# Patient Record
Sex: Female | Born: 1975 | Hispanic: Yes | Marital: Married | Smoking: Never smoker
Health system: Southern US, Community
[De-identification: ages and names within clinical notes are randomized; demographics above are authoritative.]

## PROBLEM LIST (undated history)

## (undated) DIAGNOSIS — J302 Other seasonal allergic rhinitis: Secondary | ICD-10-CM

## (undated) DIAGNOSIS — N390 Urinary tract infection, site not specified: Secondary | ICD-10-CM

## (undated) DIAGNOSIS — Z87442 Personal history of urinary calculi: Secondary | ICD-10-CM

---

## 1998-04-24 ENCOUNTER — Other Ambulatory Visit: Admission: RE | Admit: 1998-04-24 | Discharge: 1998-04-24 | Payer: Self-pay | Admitting: Gynecology

## 1998-11-07 ENCOUNTER — Other Ambulatory Visit: Admission: RE | Admit: 1998-11-07 | Discharge: 1998-11-07 | Payer: Self-pay | Admitting: Obstetrics and Gynecology

## 1999-02-16 ENCOUNTER — Other Ambulatory Visit: Admission: RE | Admit: 1999-02-16 | Discharge: 1999-02-16 | Payer: Self-pay | Admitting: Obstetrics and Gynecology

## 1999-03-28 ENCOUNTER — Other Ambulatory Visit: Admission: RE | Admit: 1999-03-28 | Discharge: 1999-03-28 | Payer: Self-pay | Admitting: Obstetrics and Gynecology

## 1999-06-29 ENCOUNTER — Other Ambulatory Visit: Admission: RE | Admit: 1999-06-29 | Discharge: 1999-06-29 | Payer: Self-pay | Admitting: Obstetrics and Gynecology

## 2000-02-11 ENCOUNTER — Other Ambulatory Visit: Admission: RE | Admit: 2000-02-11 | Discharge: 2000-02-11 | Payer: Self-pay | Admitting: Family Medicine

## 2001-03-05 ENCOUNTER — Other Ambulatory Visit: Admission: RE | Admit: 2001-03-05 | Discharge: 2001-03-05 | Payer: Self-pay | Admitting: Internal Medicine

## 2002-01-19 ENCOUNTER — Other Ambulatory Visit: Admission: RE | Admit: 2002-01-19 | Discharge: 2002-01-19 | Payer: Self-pay | Admitting: Family Medicine

## 2002-09-30 ENCOUNTER — Other Ambulatory Visit: Admission: RE | Admit: 2002-09-30 | Discharge: 2002-09-30 | Payer: Self-pay | Admitting: Obstetrics & Gynecology

## 2003-08-26 ENCOUNTER — Encounter: Payer: Self-pay | Admitting: Obstetrics & Gynecology

## 2003-08-26 ENCOUNTER — Encounter: Admission: RE | Admit: 2003-08-26 | Discharge: 2003-08-26 | Payer: Self-pay | Admitting: Obstetrics & Gynecology

## 2003-10-04 ENCOUNTER — Other Ambulatory Visit: Admission: RE | Admit: 2003-10-04 | Discharge: 2003-10-04 | Payer: Self-pay | Admitting: Obstetrics & Gynecology

## 2004-09-10 ENCOUNTER — Ambulatory Visit (HOSPITAL_COMMUNITY): Admission: RE | Admit: 2004-09-10 | Discharge: 2004-09-10 | Payer: Self-pay | Admitting: Obstetrics & Gynecology

## 2004-11-19 ENCOUNTER — Ambulatory Visit (HOSPITAL_COMMUNITY): Admission: RE | Admit: 2004-11-19 | Discharge: 2004-11-19 | Payer: Self-pay | Admitting: Urology

## 2005-01-16 ENCOUNTER — Inpatient Hospital Stay (HOSPITAL_COMMUNITY): Admission: AD | Admit: 2005-01-16 | Discharge: 2005-01-16 | Payer: Self-pay | Admitting: Obstetrics and Gynecology

## 2005-04-23 ENCOUNTER — Encounter: Admission: RE | Admit: 2005-04-23 | Discharge: 2005-04-23 | Payer: Self-pay | Admitting: Obstetrics & Gynecology

## 2005-06-26 ENCOUNTER — Inpatient Hospital Stay (HOSPITAL_COMMUNITY): Admission: AD | Admit: 2005-06-26 | Discharge: 2005-06-28 | Payer: Self-pay | Admitting: *Deleted

## 2005-06-29 ENCOUNTER — Encounter: Admission: RE | Admit: 2005-06-29 | Discharge: 2005-07-29 | Payer: Self-pay | Admitting: *Deleted

## 2005-07-30 ENCOUNTER — Encounter: Admission: RE | Admit: 2005-07-30 | Discharge: 2005-08-29 | Payer: Self-pay | Admitting: *Deleted

## 2005-08-30 ENCOUNTER — Encounter: Admission: RE | Admit: 2005-08-30 | Discharge: 2005-09-28 | Payer: Self-pay | Admitting: *Deleted

## 2005-09-29 ENCOUNTER — Encounter: Admission: RE | Admit: 2005-09-29 | Discharge: 2005-10-29 | Payer: Self-pay | Admitting: *Deleted

## 2005-10-30 ENCOUNTER — Encounter: Admission: RE | Admit: 2005-10-30 | Discharge: 2005-11-28 | Payer: Self-pay | Admitting: *Deleted

## 2005-11-29 ENCOUNTER — Encounter: Admission: RE | Admit: 2005-11-29 | Discharge: 2005-12-29 | Payer: Self-pay | Admitting: *Deleted

## 2005-12-30 ENCOUNTER — Encounter: Admission: RE | Admit: 2005-12-30 | Discharge: 2006-01-29 | Payer: Self-pay | Admitting: *Deleted

## 2006-01-22 ENCOUNTER — Ambulatory Visit: Payer: Self-pay | Admitting: Internal Medicine

## 2006-01-30 ENCOUNTER — Encounter: Admission: RE | Admit: 2006-01-30 | Discharge: 2006-02-26 | Payer: Self-pay | Admitting: *Deleted

## 2006-02-27 ENCOUNTER — Encounter: Admission: RE | Admit: 2006-02-27 | Discharge: 2006-03-29 | Payer: Self-pay | Admitting: *Deleted

## 2006-03-13 ENCOUNTER — Ambulatory Visit (HOSPITAL_COMMUNITY): Admission: RE | Admit: 2006-03-13 | Discharge: 2006-03-13 | Payer: Self-pay | Admitting: Urology

## 2006-03-30 ENCOUNTER — Encounter: Admission: RE | Admit: 2006-03-30 | Discharge: 2006-04-28 | Payer: Self-pay | Admitting: *Deleted

## 2006-04-29 ENCOUNTER — Encounter: Admission: RE | Admit: 2006-04-29 | Discharge: 2006-05-29 | Payer: Self-pay | Admitting: *Deleted

## 2006-04-30 ENCOUNTER — Ambulatory Visit: Payer: Self-pay | Admitting: Internal Medicine

## 2006-05-30 ENCOUNTER — Encounter: Admission: RE | Admit: 2006-05-30 | Discharge: 2006-06-28 | Payer: Self-pay | Admitting: *Deleted

## 2006-06-29 ENCOUNTER — Encounter: Admission: RE | Admit: 2006-06-29 | Discharge: 2006-07-29 | Payer: Self-pay | Admitting: *Deleted

## 2006-07-30 ENCOUNTER — Encounter: Admission: RE | Admit: 2006-07-30 | Discharge: 2006-08-29 | Payer: Self-pay | Admitting: *Deleted

## 2006-08-30 ENCOUNTER — Encounter: Admission: RE | Admit: 2006-08-30 | Discharge: 2006-09-28 | Payer: Self-pay | Admitting: *Deleted

## 2006-09-29 ENCOUNTER — Encounter: Admission: RE | Admit: 2006-09-29 | Discharge: 2006-10-29 | Payer: Self-pay | Admitting: *Deleted

## 2006-10-30 ENCOUNTER — Encounter: Admission: RE | Admit: 2006-10-30 | Discharge: 2006-11-28 | Payer: Self-pay | Admitting: *Deleted

## 2006-11-29 ENCOUNTER — Encounter: Admission: RE | Admit: 2006-11-29 | Discharge: 2006-12-27 | Payer: Self-pay | Admitting: *Deleted

## 2007-05-27 ENCOUNTER — Inpatient Hospital Stay (HOSPITAL_COMMUNITY): Admission: AD | Admit: 2007-05-27 | Discharge: 2007-05-27 | Payer: Self-pay | Admitting: Obstetrics and Gynecology

## 2007-08-14 ENCOUNTER — Inpatient Hospital Stay (HOSPITAL_COMMUNITY): Admission: AD | Admit: 2007-08-14 | Discharge: 2007-08-15 | Payer: Self-pay | Admitting: Obstetrics and Gynecology

## 2007-08-29 ENCOUNTER — Inpatient Hospital Stay (HOSPITAL_COMMUNITY): Admission: AD | Admit: 2007-08-29 | Discharge: 2007-08-31 | Payer: Self-pay | Admitting: *Deleted

## 2007-09-21 ENCOUNTER — Encounter: Payer: Self-pay | Admitting: Internal Medicine

## 2007-09-21 DIAGNOSIS — N3090 Cystitis, unspecified without hematuria: Secondary | ICD-10-CM | POA: Insufficient documentation

## 2008-12-09 HISTORY — PX: LITHOTRIPSY: SUR834

## 2010-04-12 ENCOUNTER — Encounter: Admission: RE | Admit: 2010-04-12 | Discharge: 2010-04-12 | Payer: Self-pay | Admitting: Obstetrics and Gynecology

## 2010-04-12 ENCOUNTER — Ambulatory Visit (HOSPITAL_COMMUNITY): Admission: RE | Admit: 2010-04-12 | Discharge: 2010-04-12 | Payer: Self-pay | Admitting: Obstetrics and Gynecology

## 2010-05-23 ENCOUNTER — Inpatient Hospital Stay (HOSPITAL_COMMUNITY): Admission: AD | Admit: 2010-05-23 | Discharge: 2010-05-25 | Payer: Self-pay | Admitting: Obstetrics and Gynecology

## 2010-12-29 ENCOUNTER — Encounter: Payer: Self-pay | Admitting: Obstetrics & Gynecology

## 2010-12-29 ENCOUNTER — Encounter: Payer: Self-pay | Admitting: Urology

## 2011-02-24 LAB — RPR: RPR Ser Ql: NONREACTIVE

## 2011-02-24 LAB — CBC
HCT: 27.4 % — ABNORMAL LOW (ref 36.0–46.0)
HCT: 32.4 % — ABNORMAL LOW (ref 36.0–46.0)
Hemoglobin: 10.8 g/dL — ABNORMAL LOW (ref 12.0–15.0)
Hemoglobin: 9.3 g/dL — ABNORMAL LOW (ref 12.0–15.0)
MCHC: 33.3 g/dL (ref 30.0–36.0)
MCHC: 33.9 g/dL (ref 30.0–36.0)
MCV: 86.7 fL (ref 78.0–100.0)
MCV: 87 fL (ref 78.0–100.0)
Platelets: 174 10*3/uL (ref 150–400)
Platelets: 210 10*3/uL (ref 150–400)
RBC: 3.15 MIL/uL — ABNORMAL LOW (ref 3.87–5.11)
RBC: 3.74 MIL/uL — ABNORMAL LOW (ref 3.87–5.11)
RDW: 14.5 % (ref 11.5–15.5)
RDW: 14.7 % (ref 11.5–15.5)
WBC: 10 10*3/uL (ref 4.0–10.5)
WBC: 9 10*3/uL (ref 4.0–10.5)

## 2011-02-24 LAB — GLUCOSE, CAPILLARY: Glucose-Capillary: 105 mg/dL — ABNORMAL HIGH (ref 70–99)

## 2011-04-23 NOTE — Consult Note (Signed)
Samantha [name], Samantha [name] NO.:  [id number]   MEDICAL RECORD NO.:  [id number]          PATIENT TYPE:  MAT   LOCATION:  [facility]                          FACILITY:  [facility]   PHYSICIAN:  Lenoard Aden, M.D.DATE OF BIRTH:  [birth year]   DATE OF CONSULTATION:  DATE OF DISCHARGE:                                 CONSULTATION   CHIEF COMPLAINT:  Left flank pain.   This is a 35 year old female, G4, P2 0-1-2 with a new onset history of  left urolithiasis, confirmed by ultrasound and seen by Urology with  increased frequency of left flank pain noted this a.m. who presents by  EMS with increased presumed renal colic.   She has no known drug allergies.   Medications are prenatal vitamins, Darvocet as needed and Macrobid.   She has a history of kidney stones as noted.  Abnormal Pap smear.  Recurrent UTIs.  Fibrocystic breast disease and unspecified neck pain in  addition to an unspecified history of pyelonephritis.  History of a  LEEP.   SOCIAL HISTORY:  Noncontributory.  She is a non-smoker, nondrinker,  denies domestic or physical violence.   Family history of varicosities.   Pregnancy is remarkable for two vaginal deliveries and one spontaneous  pregnancy loss.   PHYSICAL EXAMINATION:  On exam patient is an uncomfortable appearing  female in no acute distress.  Vital signs are stable.  Patient is  afebrile.  HEENT normal. Lungs clear.  Heart regular rhythm.  Abdomen  soft, __________, nontender.  No CVA tenderness noted.  Left flank pain  is palpably over the left flank to left portion of the left iliac crest.  Cervix is closed, long and out of the pelvis.  Extremities show no  cords.  Neurological exam is nonfocal.   IMPRESSION:  Presumed left renal colic with presumed urolithiasis as  previously documented.   Plan will be to check urinalysis, check renal ultrasound, check CBC with  differential to rule out pyelonephritis.  Will consult Urology as  needed.  Given  Demerol and Phenergan for pain relief now.      Lenoard Aden, M.D.  Electronically Signed     RJT/MEDQ  D:  05/27/2007  T:  05/27/2007  Job:  [id number]   cc:   Lenoard Aden, M.D.  Fax: [phone]

## 2011-09-19 LAB — CBC
HCT: 28.2 — ABNORMAL LOW
HCT: 34.5 — ABNORMAL LOW
Hemoglobin: 11.5 — ABNORMAL LOW
Hemoglobin: 9.3 — ABNORMAL LOW
MCHC: 33.1
MCHC: 33.3
MCV: 83.2
MCV: 83.4
Platelets: 205
Platelets: 263
RBC: 3.39 — ABNORMAL LOW
RBC: 4.13
RDW: 14.4 — ABNORMAL HIGH
RDW: 14.9 — ABNORMAL HIGH
WBC: 10.8 — ABNORMAL HIGH
WBC: 10.8 — ABNORMAL HIGH

## 2011-09-19 LAB — URINALYSIS, ROUTINE W REFLEX MICROSCOPIC
Glucose, UA: NEGATIVE
Ketones, ur: 15 — AB
Leukocytes, UA: NEGATIVE
Nitrite: NEGATIVE
Protein, ur: 30 — AB
Specific Gravity, Urine: 1.02
Urobilinogen, UA: 0.2
pH: 6

## 2011-09-19 LAB — RPR: RPR Ser Ql: NONREACTIVE

## 2011-09-19 LAB — URINE MICROSCOPIC-ADD ON

## 2011-09-19 LAB — CCBB MATERNAL DONOR DRAW

## 2011-09-20 LAB — CBC
HCT: 33 — ABNORMAL LOW
Hemoglobin: 11.2 — ABNORMAL LOW
MCHC: 33.8
MCV: 84.8
Platelets: 246
RBC: 3.89
RDW: 14.2 — ABNORMAL HIGH
WBC: 13.5 — ABNORMAL HIGH

## 2011-09-20 LAB — URINALYSIS, ROUTINE W REFLEX MICROSCOPIC
Bilirubin Urine: NEGATIVE
Glucose, UA: NEGATIVE
Ketones, ur: 15 — AB
Leukocytes, UA: NEGATIVE
Nitrite: NEGATIVE
Protein, ur: NEGATIVE
Specific Gravity, Urine: 1.01
Urobilinogen, UA: 0.2
pH: 6

## 2011-09-20 LAB — URINE MICROSCOPIC-ADD ON

## 2011-09-20 LAB — RPR: RPR Ser Ql: NONREACTIVE

## 2011-09-25 LAB — URINALYSIS, ROUTINE W REFLEX MICROSCOPIC
Bilirubin Urine: NEGATIVE
Glucose, UA: NEGATIVE
Ketones, ur: NEGATIVE
Nitrite: NEGATIVE
Protein, ur: NEGATIVE
Specific Gravity, Urine: 1.01
Urobilinogen, UA: 0.2
pH: 7.5

## 2011-09-25 LAB — URINE CULTURE: Colony Count: >=100000

## 2011-09-25 LAB — URINE MICROSCOPIC-ADD ON

## 2011-09-25 LAB — CBC
HCT: 33 — ABNORMAL LOW
Hemoglobin: 11.2 — ABNORMAL LOW
MCHC: 33.9
MCV: 89.7
Platelets: 221
RBC: 3.69 — ABNORMAL LOW
RDW: 13.2
WBC: 9.4

## 2011-09-25 LAB — DIFFERENTIAL
Basophils Absolute: 0
Basophils Relative: 0
Eosinophils Absolute: 0.1
Eosinophils Relative: 1
Monocytes Absolute: 0.6

## 2012-04-30 ENCOUNTER — Encounter (HOSPITAL_COMMUNITY): Payer: Self-pay | Admitting: Certified Registered Nurse Anesthetist

## 2012-04-30 ENCOUNTER — Inpatient Hospital Stay (HOSPITAL_COMMUNITY): Payer: BC Managed Care – PPO | Admitting: Certified Registered Nurse Anesthetist

## 2012-04-30 ENCOUNTER — Inpatient Hospital Stay (HOSPITAL_COMMUNITY): Payer: BC Managed Care – PPO

## 2012-04-30 ENCOUNTER — Encounter (HOSPITAL_COMMUNITY): Payer: Self-pay | Admitting: *Deleted

## 2012-04-30 ENCOUNTER — Encounter (HOSPITAL_COMMUNITY)
Admission: AD | Disposition: A | Discharge status: Home / Self Care | Payer: Self-pay | Source: Other Acute Inpatient Hospital | Attending: Urology

## 2012-04-30 ENCOUNTER — Emergency Department (EMERGENCY_DEPARTMENT_HOSPITAL)
Admission: AD | Admit: 2012-04-30 | Discharge: 2012-04-30 | Disposition: A | Discharge status: Home Health | Payer: BC Managed Care – PPO | Source: Ambulatory Visit | Admitting: Obstetrics & Gynecology

## 2012-04-30 ENCOUNTER — Ambulatory Visit (HOSPITAL_COMMUNITY)
Admission: AD | Admit: 2012-04-30 | Discharge: 2012-05-01 | Disposition: A | Discharge status: Home Health | Payer: BC Managed Care – PPO | Source: Ambulatory Visit | Attending: Obstetrics & Gynecology | Admitting: Obstetrics & Gynecology

## 2012-04-30 DIAGNOSIS — N200 Calculus of kidney: Secondary | ICD-10-CM

## 2012-04-30 DIAGNOSIS — F419 Anxiety disorder, unspecified: Secondary | ICD-10-CM | POA: Clinically undetermined

## 2012-04-30 DIAGNOSIS — F32A Depression, unspecified: Secondary | ICD-10-CM | POA: Clinically undetermined

## 2012-04-30 DIAGNOSIS — R1031 Right lower quadrant pain: Secondary | ICD-10-CM | POA: Insufficient documentation

## 2012-04-30 DIAGNOSIS — Z8632 Personal history of gestational diabetes: Secondary | ICD-10-CM

## 2012-04-30 DIAGNOSIS — N1330 Unspecified hydronephrosis: Secondary | ICD-10-CM | POA: Insufficient documentation

## 2012-04-30 DIAGNOSIS — N201 Calculus of ureter: Secondary | ICD-10-CM | POA: Insufficient documentation

## 2012-04-30 DIAGNOSIS — R509 Fever, unspecified: Secondary | ICD-10-CM | POA: Insufficient documentation

## 2012-04-30 DIAGNOSIS — F329 Major depressive disorder, single episode, unspecified: Secondary | ICD-10-CM | POA: Clinically undetermined

## 2012-04-30 DIAGNOSIS — R110 Nausea: Secondary | ICD-10-CM | POA: Insufficient documentation

## 2012-04-30 HISTORY — PX: CYSTOSCOPY W/ RETROGRADES: SHX1426

## 2012-04-30 LAB — DIFFERENTIAL
Basophils Relative: 1 % (ref 0–1)
Eosinophils Absolute: 0 10*3/uL (ref 0.0–0.7)
Monocytes Absolute: 0.7 10*3/uL (ref 0.1–1.0)
Neutrophils Relative %: 84 % — ABNORMAL HIGH (ref 43–77)

## 2012-04-30 LAB — COMPREHENSIVE METABOLIC PANEL
ALT: 12 U/L (ref 0–35)
Calcium: 8.6 mg/dL (ref 8.4–10.5)
Creatinine, Ser: 1.02 mg/dL (ref 0.50–1.10)
GFR calc Af Amer: 81 mL/min — ABNORMAL LOW (ref >90)
Glucose, Bld: 111 mg/dL — ABNORMAL HIGH (ref 70–99)
Sodium: 134 mEq/L — ABNORMAL LOW (ref 135–145)
Total Protein: 7.4 g/dL (ref 6.0–8.3)

## 2012-04-30 LAB — URINE MICROSCOPIC-ADD ON

## 2012-04-30 LAB — URINALYSIS, ROUTINE W REFLEX MICROSCOPIC
Bilirubin Urine: NEGATIVE
Ketones, ur: NEGATIVE mg/dL
Nitrite: NEGATIVE
Urobilinogen, UA: 0.2 mg/dL (ref 0.0–1.0)

## 2012-04-30 LAB — CBC
Hemoglobin: 11.5 g/dL — ABNORMAL LOW (ref 12.0–15.0)
MCH: 29.6 pg (ref 26.0–34.0)
MCHC: 33 g/dL (ref 30.0–36.0)
Platelets: 206 10*3/uL (ref 150–400)

## 2012-04-30 LAB — AMYLASE: Amylase: 42 U/L (ref 0–105)

## 2012-04-30 LAB — LIPASE, BLOOD: Lipase: 18 U/L (ref 11–59)

## 2012-04-30 SURGERY — CYSTOSCOPY, WITH RETROGRADE PYELOGRAM
Anesthesia: General | Site: Ureter | Laterality: Right | Wound class: Clean Contaminated

## 2012-04-30 MED ORDER — HYDROMORPHONE HCL PF 1 MG/ML IJ SOLN
1.0000 mg | Freq: Once | INTRAMUSCULAR | Status: AC
  Administered 2012-04-30: 1 mg via INTRAVENOUS
  Filled 2012-04-30 (×2): qty 1

## 2012-04-30 MED ORDER — ONDANSETRON HCL 4 MG PO TABS: 8.0000 mg | ORAL_TABLET | Freq: Once | ORAL | Status: DC

## 2012-04-30 MED ORDER — LACTATED RINGERS IV SOLN
INTRAVENOUS | Status: DC
  Administered 2012-05-01: via INTRAVENOUS

## 2012-04-30 MED ORDER — LIDOCAINE HCL (CARDIAC) 20 MG/ML IV SOLN
INTRAVENOUS | Status: DC | PRN
  Administered 2012-04-30: 80 mg via INTRAVENOUS

## 2012-04-30 MED ORDER — ONDANSETRON HCL 4 MG/2ML IJ SOLN
INTRAMUSCULAR | Status: DC | PRN
  Administered 2012-04-30: 4 mg via INTRAVENOUS

## 2012-04-30 MED ORDER — ROCURONIUM BROMIDE 100 MG/10ML IV SOLN
INTRAVENOUS | Status: DC | PRN
  Administered 2012-04-30: 20 mg via INTRAVENOUS

## 2012-04-30 MED ORDER — IOHEXOL 300 MG/ML  SOLN
INTRAMUSCULAR | Status: DC | PRN
  Administered 2012-04-30: 7 mL via INTRAVENOUS

## 2012-04-30 MED ORDER — DEXTROSE 5 % IV SOLN
1.0000 g | INTRAVENOUS | Status: DC | PRN
  Administered 2012-04-30: 1 g via INTRAVENOUS

## 2012-04-30 MED ORDER — DEXTROSE 5 % IV SOLN
1.0000 g | INTRAVENOUS | Status: DC
  Filled 2012-04-30: qty 10

## 2012-04-30 MED ORDER — LACTATED RINGERS IV SOLN
INTRAVENOUS | Status: DC | PRN
  Administered 2012-04-30: 23:00:00 via INTRAVENOUS

## 2012-04-30 MED ORDER — ACETAMINOPHEN 325 MG PO TABS
650.0000 mg | ORAL_TABLET | Freq: Once | ORAL | Status: DC
  Filled 2012-04-30: qty 2

## 2012-04-30 MED ORDER — NEOSTIGMINE METHYLSULFATE 1 MG/ML IJ SOLN
INTRAMUSCULAR | Status: DC | PRN
  Administered 2012-04-30: 3 mg via INTRAVENOUS

## 2012-04-30 MED ORDER — STERILE WATER FOR IRRIGATION IR SOLN
Status: DC | PRN
  Administered 2012-04-30: 3000 mL

## 2012-04-30 MED ORDER — IOHEXOL 300 MG/ML  SOLN
100.0000 mL | Freq: Once | INTRAMUSCULAR | Status: AC | PRN
  Administered 2012-04-30: 100 mL via INTRAVENOUS

## 2012-04-30 MED ORDER — DEXTROSE 5 % IV SOLN
1.0000 g | Freq: Once | INTRAVENOUS | Status: DC
  Filled 2012-04-30: qty 10

## 2012-04-30 MED ORDER — SODIUM CHLORIDE 0.9 % IV SOLN
25.0000 mg | Freq: Once | INTRAVENOUS | Status: AC
  Administered 2012-04-30: 25 mg via INTRAVENOUS
  Filled 2012-04-30: qty 1

## 2012-04-30 MED ORDER — HYDROMORPHONE HCL PF 1 MG/ML IJ SOLN: 0.2500 mg | INTRAMUSCULAR | Status: DC | PRN

## 2012-04-30 MED ORDER — SODIUM CHLORIDE 0.9 % IR SOLN
Status: DC | PRN
  Administered 2012-04-30: 1000 mL

## 2012-04-30 MED ORDER — HYDROMORPHONE HCL PF 1 MG/ML IJ SOLN
1.0000 mg | Freq: Once | INTRAMUSCULAR | Status: AC
  Administered 2012-04-30: 1 mg via INTRAVENOUS
  Filled 2012-04-30: qty 1

## 2012-04-30 MED ORDER — LACTATED RINGERS IV SOLN: INTRAVENOUS | Status: DC

## 2012-04-30 MED ORDER — PHENYLEPHRINE HCL 10 MG/ML IJ SOLN
INTRAMUSCULAR | Status: DC | PRN
  Administered 2012-04-30: 160 ug via INTRAVENOUS
  Administered 2012-04-30 (×2): 80 ug via INTRAVENOUS

## 2012-04-30 MED ORDER — FENTANYL CITRATE 0.05 MG/ML IJ SOLN
INTRAMUSCULAR | Status: DC | PRN
  Administered 2012-04-30: 50 ug via INTRAVENOUS

## 2012-04-30 MED ORDER — MIDAZOLAM HCL 5 MG/5ML IJ SOLN
INTRAMUSCULAR | Status: DC | PRN
  Administered 2012-04-30: 1 mg via INTRAVENOUS

## 2012-04-30 MED ORDER — GLYCOPYRROLATE 0.2 MG/ML IJ SOLN
INTRAMUSCULAR | Status: DC | PRN
  Administered 2012-04-30: .4 mg via INTRAVENOUS

## 2012-04-30 MED ORDER — LACTATED RINGERS IV SOLN
INTRAVENOUS | Status: DC
  Administered 2012-04-30: 20:00:00 via INTRAVENOUS

## 2012-04-30 MED ORDER — ONDANSETRON HCL 4 MG/2ML IJ SOLN
4.0000 mg | Freq: Once | INTRAMUSCULAR | Status: AC
  Administered 2012-04-30: 4 mg via INTRAVENOUS
  Filled 2012-04-30: qty 2

## 2012-04-30 MED ORDER — SUCCINYLCHOLINE CHLORIDE 20 MG/ML IJ SOLN
INTRAMUSCULAR | Status: DC | PRN
  Administered 2012-04-30: 100 mg via INTRAVENOUS

## 2012-04-30 MED ORDER — 0.9 % SODIUM CHLORIDE (POUR BTL) OPTIME
TOPICAL | Status: DC | PRN
  Administered 2012-04-30: 1000 mL

## 2012-04-30 MED ORDER — PROPOFOL 10 MG/ML IV EMUL
INTRAVENOUS | Status: DC | PRN
  Administered 2012-04-30: 180 mg via INTRAVENOUS

## 2012-04-30 SURGICAL SUPPLY — 22 items
ADAPTER CATH URET PLST 4-6FR (CATHETERS) ×2 IMPLANT
ADPR CATH URET STRL DISP 4-6FR (CATHETERS) ×1
BAG URO CATCHER STRL LF (DRAPE) ×2 IMPLANT
BASKET ZERO TIP NITINOL 2.4FR (BASKET) IMPLANT
BSKT STON RTRVL ZERO TP 2.4FR (BASKET)
CATH INTERMIT  6FR 70CM (CATHETERS) ×1 IMPLANT
CATH URET 5FR 28IN CONE TIP (BALLOONS) ×1
CATH URET 5FR 70CM CONE TIP (BALLOONS) IMPLANT
CLOTH BEACON ORANGE TIMEOUT ST (SAFETY) ×2 IMPLANT
DRAPE CAMERA CLOSED 9X96 (DRAPES) ×2 IMPLANT
GLOVE BIOGEL M 7.0 STRL (GLOVE) ×2 IMPLANT
GLOVE SURG SS PI 8.5 STRL IVOR (GLOVE) ×1
GLOVE SURG SS PI 8.5 STRL STRW (GLOVE) IMPLANT
GOWN PREVENTION PLUS XLARGE (GOWN DISPOSABLE) ×2 IMPLANT
GOWN STRL NON-REIN LRG LVL3 (GOWN DISPOSABLE) ×2 IMPLANT
GUIDEWIRE ANG ZIPWIRE 038X150 (WIRE) ×1 IMPLANT
GUIDEWIRE STR DUAL SENSOR (WIRE) ×1 IMPLANT
MANIFOLD NEPTUNE II (INSTRUMENTS) ×2 IMPLANT
PACK CYSTO (CUSTOM PROCEDURE TRAY) ×2 IMPLANT
STENT CONTOUR 6FRX24X.038 (STENTS) ×1 IMPLANT
TUBING CONNECTING 10 (TUBING) ×2 IMPLANT
WIRE COONS/BENSON .038X145CM (WIRE) ×1 IMPLANT

## 2012-04-30 NOTE — MAU Note (Signed)
Saturday night started, fever, nausea and abd pain.  Has gotten worse.  Hx of kidney stone- thought it was the same,  Went to Dr on [facility], was dx with UTI- given antibiotics and pain meds. Urologist on Tues, dx with kidney stone. Went to Urgent care because was in so much pain.  Has gotten worse, unable to keep anything down.  Called urologist- couldn't get in until next week.

## 2012-04-30 NOTE — MAU Provider Note (Signed)
  History     CSN: [id number]  Arrival date and time: 04/30/12 1410   First Provider Initiated Contact with Patient 04/30/12 1549      Chief Complaint  Patient presents with  . Abdominal Pain   HPI This is a 36 y.o. female who presents with c/o abdominal pain, nausea and fever. Seen as described below but they could not see her today. "Feels terrible".  RN NOTE: Saturday night started, fever, nausea and abd pain. Has gotten worse. Hx of kidney stone- thought it was the same, Went to Dr on [facility], was dx with UTI- given antibiotics and pain meds. Urologist on Tues, dx with kidney stone. Went to Urgent care because was in so much pain. Has gotten worse, unable to keep anything down. Called urologist- couldn't get in until next week.  OB History    Grav Para Term Preterm Abortions TAB SAB Ect Mult Living   5 4 4  0 1 0 1 0 0 4      Past Medical History  Diagnosis Date  . Chronic kidney disease     No past surgical history on file.  No family history on file.  History  Substance Use Topics  . Smoking status: Never Smoker   . Smokeless tobacco: Not on file  . Alcohol Use: No    Allergies: No Known Allergies  Prescriptions prior to admission  Medication Sig Dispense Refill  . ciprofloxacin (CIPRO) 500 MG/5ML (10%) suspension Take by mouth 2 (two) times daily. uti      . Tamsulosin HCl (FLOMAX) 0.4 MG CAPS Take by mouth.      . traMADol (ULTRAM) 50 MG tablet Take 50 mg by mouth every 6 (six) hours as needed. pain        Review of Systems  Constitutional: Positive for fever, chills and malaise/fatigue.  Cardiovascular: Negative for chest pain.  Gastrointestinal: Positive for nausea and abdominal pain.  Genitourinary: Positive for dysuria and flank pain.  Neurological: Positive for weakness.   Physical Exam   Blood pressure 123/77, pulse 117, temperature 103 F (39.4 C), temperature source Oral, resp. rate 18, height 5\' 2"  (1.575 m), weight 131 lb 8 oz (59.648 kg), last  menstrual period 04/17/2012, SpO2 96.00%.  Physical Exam  Constitutional: She is oriented to person, place, and time. She appears well-developed and well-nourished. No distress (Ill appearing).  HENT:  Head: Normocephalic.  Cardiovascular: Normal rate.   Respiratory: Effort normal.  GI: Soft. She exhibits no distension and no mass. There is tenderness. There is no rebound and no guarding.  Genitourinary:       No gynecological complaints  Musculoskeletal: Normal range of motion.  Neurological: She is alert and oriented to person, place, and time.  Skin: Skin is warm and dry.  Psychiatric: She has a normal mood and affect.    MAU Course  Procedures  MDM Consulted Dr Penne Lash Labs ordered   Assessment and Plan  Dr Penne Lash will see and evaluate  [facility] 04/30/2012, 3:59 PM

## 2012-04-30 NOTE — Op Note (Signed)
Samantha [name] is a 36 y.o.   04/30/2012  General  Pre-op diagnosis: Right hydronephrosis, right ureteral calculus, bilateral renal calculi  Postop diagnosis: Same  Procedure done: Cystoscopy, right retrograde pyelogram, ureteroscopy and insertion of double-J stent  Surgeon: Wendie Simmer. Merrit Friesen  Anesthesia: General  Indication: Patient is a 36 years old female with a long history of kidney stone. For the past 6-7 days she had been complaining of right-sided abdominal pain. She was seen in the office 2 days ago for mild right flank pain. KUB showed bilateral renal calculi without evidence of ureteral calculi. The pain became worse yesterday and was associated with nausea and fever. Her temperature was up to 102.1. She went to the emergency room. A CT scan revealed moderate to severe right hydronephrosis secondary to a 9 mm mid ureteral stone and bilateral renal calculi. She is now scheduled for cystoscopy and insertion of double-J stent.  Procedure: The patient was identified by her wrist band and proper timeout was taken.  Under general anesthesia she was prepped and draped and placed in the dorsolithotomy position. A panendoscope was inserted in the bladder. Urine was obtained for culture and sensitivity. The bladder mucosa is reddened.   There is no stone or tumor in the bladder. The ureteral orifices are in normal position and shape.  Right retrograde pyelogram:  The cone-tip catheter was passed through the cystoscope and right ureteral orifice. Contrast was then injected through the cone-tip catheter. The contrast would not go up the ureter and came back in the bladder. A sensor wire would not go through the right ureteral orifice. A glide wire was then passed through a #6 [place] open-ended catheter and passed through the right ureteral orifice. The Glidewire coiled in the distal ureter and would not advance. I then removed the glide wire and the open-ended catheter. The cystoscope was also  removed. A semirigid ureteroscope was then passed in the bladder through the right ureteral orifice. There was a submucosal tear explaining why the Glidewire would not go up. The ureteroscope was then advanced through the normal ureter. The ureter appears normal. There is a large stone in the mid ureter. Since the patient is septic I believe it is best to put a double-J stent and treat the stone at a later date.  A sensor wire was then passed through the ureteroscope into the renal pelvis. The ureteroscope was then removed. The sensor wire was backloaded into the cystoscope and a #6 French-[id number] double-J stent was passed over the sensor wire. The proximal curl of the double-J stent is in the renal pelvis; the distal curl is in the bladder. The bladder was then emptied and the cystoscope and guidewire removed.  The patient tolerated the procedure well and left the OR in satisfactory condition to postanesthesia care unit.

## 2012-04-30 NOTE — Anesthesia Preprocedure Evaluation (Addendum)
Anesthesia Evaluation  Patient identified by MRN, date of birth, ID band Patient awake  General Assessment Comment:Ate lunch  Reviewed: Allergy & Precautions, H&P , NPO status , Patient's Chart, lab work & pertinent test results, reviewed documented beta blocker date and time   History of Anesthesia Complications (+) AWARENESS UNDER ANESTHESIA  Airway Mallampati: II TM Distance: >3 FB Neck ROM: Full    Dental  (+) Teeth Intact and Dental Advisory Given   Pulmonary neg pulmonary ROS,  breath sounds clear to auscultation        Cardiovascular negative cardio ROS  Rhythm:Regular Rate:Normal  Denies cardiac symptoms   Neuro/Psych negative neurological ROS  negative psych ROS   GI/Hepatic negative GI ROS, Neg liver ROS,   Endo/Other  negative endocrine ROS  Renal/GU Kidney stones  negative genitourinary   Musculoskeletal negative musculoskeletal ROS (+)   Abdominal   Peds negative pediatric ROS (+)  Hematology negative hematology ROS (+)   Anesthesia Other Findings   Reproductive/Obstetrics negative OB ROS                          Anesthesia Physical Anesthesia Plan  ASA: I and Emergent  Anesthesia Plan: General   Post-op Pain Management:    Induction: Intravenous, Rapid sequence and Cricoid pressure planned  Airway Management Planned: Oral ETT  Additional Equipment:   Intra-op Plan:   Post-operative Plan: Extubation in OR  Informed Consent: I have reviewed the patients History and Physical, chart, labs and discussed the procedure including the risks, benefits and alternatives for the proposed anesthesia with the patient or authorized representative who has indicated his/her understanding and acceptance.   Dental advisory given  Plan Discussed with: CRNA and Surgeon  Anesthesia Plan Comments:         Anesthesia Quick Evaluation

## 2012-04-30 NOTE — MAU Note (Signed)
Pt statespain started 04/25/2012. Pt was seen at Urgent Care and was told that she had a urinary tract infection. Went to urologist on Tuesday and was told she could not be seen.

## 2012-04-30 NOTE — H&P (Signed)
History and Physical  Chief Complaint: Right flank pain, fever.  History of Present Illness: Patient is a 36 years old female who has been complaining of right-sided abdominal pain for 6-7 days. She has a past history of kidney stone. She was seen in the office 2 days ago for mild right flank pain. KUB showed bilateral small renal calculi. She was sent home on analgesics. The pain became severe today and was associated with a fever, nausea. She had a temperature of 102. She went to the [facility] hospital emergency room. CT scan showed bilateral renal calculi, moderate to severe right hydronephrosis secondary to a 9 mm mid ureteral calculus.  Past Medical History  Diagnosis Date  . Chronic kidney disease    No past surgical history on file.  Medications: Reviewed Allergies: No Known Allergies  No family history on file. Social History:  reports that she has never smoked. She does not have any smokeless tobacco history on file. She reports that she does not drink alcohol or use illicit drugs.  ROS: All systems are reviewed and negative except as noted.   Physical Exam:  Vital signs in last 24 hours: Temp:  [102.1 F (38.9 C)-103 F (39.4 C)] 102.1 F (38.9 C) (05/23 1900) Pulse Rate:  [117-132] 132  (05/23 2018) Resp:  [18] 18  (05/23 1435) BP: (116-123)/(63-77) 116/63 mmHg (05/23 2018) SpO2:  [96 %] 96 % (05/23 1435) Weight:  [131 lb 8 oz (59.648 kg)] 131 lb 8 oz (59.648 kg) (05/23 1435)  Cardiovascular: Skin warm; not flushed Respiratory: Breaths quiet; no shortness of breath Abdomen: No masses Neurological: Normal sensation to touch Musculoskeletal: Normal motor function arms and legs Lymphatics: No inguinal adenopathy Skin: No rashes Genitourinary:Normal female genitalia.  Laboratory Data:  Results for orders placed during the hospital encounter of 04/30/12 (from the past 24 hour(s))  URINALYSIS, ROUTINE W REFLEX MICROSCOPIC     Status: Abnormal   Collection Time   04/30/12  2:35 PM      Component Value Range   Color, Urine YELLOW  YELLOW    APPearance HAZY (*) CLEAR    Specific Gravity, Urine 1.025  1.005 - 1.030    pH 6.5  5.0 - 8.0    Glucose, UA NEGATIVE  NEGATIVE (mg/dL)   Hgb urine dipstick LARGE (*) NEGATIVE    Bilirubin Urine NEGATIVE  NEGATIVE    Ketones, ur NEGATIVE  NEGATIVE (mg/dL)   Protein, ur NEGATIVE  NEGATIVE (mg/dL)   Urobilinogen, UA 0.2  0.0 - 1.0 (mg/dL)   Nitrite NEGATIVE  NEGATIVE    Leukocytes, UA SMALL (*) NEGATIVE   URINE MICROSCOPIC-ADD ON     Status: Abnormal   Collection Time   04/30/12  2:35 PM      Component Value Range   Squamous Epithelial / LPF FEW (*) RARE    WBC, UA 21-50  <3 (WBC/hpf)   RBC / HPF 7-10  <3 (RBC/hpf)   Bacteria, UA MANY (*) RARE    Urine-Other MUCOUS PRESENT    CBC     Status: Abnormal   Collection Time   04/30/12  3:28 PM      Component Value Range   WBC 9.1  4.0 - 10.5 (K/uL)   RBC 3.89  3.87 - 5.11 (MIL/uL)   Hemoglobin 11.5 (*) 12.0 - 15.0 (g/dL)   HCT 30.8 (*) 65.7 - 46.0 (%)   MCV 89.5  78.0 - 100.0 (fL)   MCH 29.6  26.0 - 34.0 (pg)   MCHC  33.0  30.0 - 36.0 (g/dL)   RDW 16.1  09.6 - 04.5 (%)   Platelets 206  150 - 400 (K/uL)  DIFFERENTIAL     Status: Abnormal   Collection Time   04/30/12  3:28 PM      Component Value Range   Neutrophils Relative 84 (*) 43 - 77 (%)   Lymphocytes Relative 7 (*) 12 - 46 (%)   Monocytes Relative 8  3 - 12 (%)   Eosinophils Relative 0  0 - 5 (%)   Basophils Relative 1  0 - 1 (%)   Neutro Abs 7.7  1.7 - 7.7 (K/uL)   Lymphs Abs 0.6 (*) 0.7 - 4.0 (K/uL)   Monocytes Absolute 0.7  0.1 - 1.0 (K/uL)   Eosinophils Absolute 0.0  0.0 - 0.7 (K/uL)   Basophils Absolute 0.1  0.0 - 0.1 (K/uL)   WBC Morphology ATYPICAL LYMPHOCYTES    COMPREHENSIVE METABOLIC PANEL     Status: Abnormal   Collection Time   04/30/12  3:28 PM      Component Value Range   Sodium 134 (*) 135 - 145 (mEq/L)   Potassium 3.9  3.5 - 5.1 (mEq/L)   Chloride 96  96 - 112 (mEq/L)   CO2  28  19 - 32 (mEq/L)   Glucose, Bld 111 (*) 70 - 99 (mg/dL)   BUN 15  6 - 23 (mg/dL)   Creatinine, Ser 4.09  0.50 - 1.10 (mg/dL)   Calcium 8.6  8.4 - 81.1 (mg/dL)   Total Protein 7.4  6.0 - 8.3 (g/dL)   Albumin 3.6  3.5 - 5.2 (g/dL)   AST 15  0 - 37 (U/L)   ALT 12  0 - 35 (U/L)   Alkaline Phosphatase 83  39 - 117 (U/L)   Total Bilirubin 1.2  0.3 - 1.2 (mg/dL)   GFR calc non Af Amer 70 (*) >90 (mL/min)   GFR calc Af Amer 81 (*) >90 (mL/min)  LIPASE, BLOOD     Status: Normal   Collection Time   04/30/12  3:28 PM      Component Value Range   Lipase 18  11 - 59 (U/L)  AMYLASE     Status: Normal   Collection Time   04/30/12  3:28 PM      Component Value Range   Amylase 42  0 - 105 (U/L)   No results found for this or any previous visit (from the past 240 hour(s)). Creatinine:  Basename 04/30/12 1528  CREATININE 1.02    Xrays:CT scan reviewed: bilateral renal calculi, moderate to severe right hydronephrosis secondary to a 9 mm mid ureteral calculus.   Impression/Assessment:  Bilateral renal calculi.  Right hydronephrosis.  Right ureteral calculus.  Plan:  Cystoscopy, JJ stent  Jehiel Koepp-HENRY 04/30/2012, 10:39 PM

## 2012-04-30 NOTE — Transfer of Care (Signed)
Immediate Anesthesia Transfer of Care Note  Patient: Samantha [name]  Procedure(s) Performed: Procedure(s) (LRB): CYSTOSCOPY WITH RETROGRADE PYELOGRAM (Right)  Patient Location: PACU  Anesthesia Type: General  Level of Consciousness: awake, alert , oriented and patient cooperative  Airway & Oxygen Therapy: Patient Spontanous Breathing and Patient connected to face mask oxygen  Post-op Assessment: Report given to PACU RN, Post -op Vital signs reviewed and stable and Patient moving all extremities  Post vital signs: Reviewed and stable  Complications: No apparent anesthesia complications

## 2012-04-30 NOTE — Preoperative (Signed)
Beta Blockers   Reason not to administer Beta Blockers:Not Applicable 

## 2012-04-30 NOTE — MAU Provider Note (Signed)
Pt seen and examined.  Pt has pain in right lower quadrant.  Mild rebound.  There is also right cva tenderness.  Will get CT to eval for appendicitis and kidney stones

## 2012-05-01 ENCOUNTER — Encounter (HOSPITAL_COMMUNITY): Payer: Self-pay

## 2012-05-01 MED ORDER — OXYCODONE-ACETAMINOPHEN 5-325 MG PO TABS: 1.0000 | ORAL_TABLET | ORAL | Status: DC | PRN

## 2012-05-01 MED ORDER — ZOLPIDEM TARTRATE 5 MG PO TABS: 5.0000 mg | ORAL_TABLET | Freq: Every evening | ORAL | Status: DC | PRN

## 2012-05-01 MED ORDER — ACETAMINOPHEN 325 MG PO TABS
650.0000 mg | ORAL_TABLET | ORAL | Status: DC | PRN
  Administered 2012-05-01 (×2): 650 mg via ORAL
  Filled 2012-05-01: qty 2

## 2012-05-01 MED ORDER — DEXTROSE-NACL 5-0.45 % IV SOLN
INTRAVENOUS | Status: DC
  Administered 2012-05-01 (×2): via INTRAVENOUS

## 2012-05-01 MED ORDER — ONDANSETRON HCL 4 MG PO TABS: 4.0000 mg | ORAL_TABLET | Freq: Four times a day (QID) | ORAL | Status: DC | PRN

## 2012-05-01 MED ORDER — HYDROMORPHONE HCL PF 1 MG/ML IJ SOLN: 1.0000 mg | INTRAMUSCULAR | Status: DC | PRN

## 2012-05-01 MED ORDER — CEFAZOLIN SODIUM 1-5 GM-% IV SOLN
1.0000 g | Freq: Three times a day (TID) | INTRAVENOUS | Status: DC
  Administered 2012-05-01: 1 g via INTRAVENOUS
  Filled 2012-05-01 (×3): qty 50

## 2012-05-01 MED ORDER — ONDANSETRON HCL 4 MG/2ML IJ SOLN: 4.0000 mg | Freq: Four times a day (QID) | INTRAMUSCULAR | Status: DC | PRN

## 2012-05-01 NOTE — Progress Notes (Signed)
Due to complications with EPIC, I am unable to access pt's records at this time. Bed placement is aware and attempting to correct the issue. Eber Jones, RN in PACU requesting pt in OR. She is aware that I am unable to see pt's chart or orders at this time and states to complete informed consent for cystoscopy with ureteral stent placement and send pt down. She states that she can access her records there and that Dr. Brunilda Payor is already there. Pt verbalizes agreement that she is to have a procedure tonight although she is unable to state what type at this time.

## 2012-05-01 NOTE — Anesthesia Postprocedure Evaluation (Signed)
  Anesthesia Post-op Note  Patient: Samantha [name]  Procedure(s) Performed: Procedure(s) (LRB): CYSTOSCOPY WITH RETROGRADE PYELOGRAM (Right)  Patient Location: PACU  Anesthesia Type: General  Level of Consciousness: oriented and sedated  Airway and Oxygen Therapy: Patient Spontanous Breathing  Post-op Pain: mild  Post-op Assessment: Post-op Vital signs reviewed, Patient's Cardiovascular Status Stable, Respiratory Function Stable and Patent Airway  Post-op Vital Signs: stable  Complications: No apparent anesthesia complications

## 2012-05-01 NOTE — Progress Notes (Addendum)
Pt arrived to unit via [facility] from [facility]. No verbal report called. [facility] transporter attempted to give a brief report. Pt is alert and oriented.Oriented to room. Safety intact. Awaiting report and orders.Will monitor

## 2012-05-01 NOTE — Discharge Instructions (Signed)
Has pain medication and Cipro at home.

## 2012-05-01 NOTE — Discharge Summary (Signed)
Patient ID: Samantha [name] MRN: [id number] DOB/AGE: March 21, 1976 36 y.o.  Admit date: 04/30/2012 Discharge date: 05/01/2012  Admission Diagnoses: KIDNEY STONES ureteral obstruction  Discharge Diagnoses:    Discharged Condition:Improved  Hospital Course: The patient was seen last night for severe right flank pain associated with nausea and fever.  Temperature was up to 102.1.  She had cystoscopy, right retrograde pyelogram and JJ stent.  She is doing well, does not have any pain or fever.  Will discharge home today.     Significant Diagnostic Studies: Ct Abdomen Pelvis W Wo Contrast  04/30/2012  *RADIOLOGY REPORT*  Clinical Data: Fever.  Right lower quadrant abdominal pain and right flank pain.  History of urinary tract calculi.  CT ABDOMEN AND PELVIS WITHOUT AND WITH CONTRAST  Technique:  Multidetector CT imaging of the abdomen and pelvis was performed without contrast material in one or both body regions, followed by contrast material(s) and further sections in one or both body regions.  Contrast: OMNIPAQUE IOHEXOL 300 MG/ML. Oral contrast was also administered.  Comparison: Abdominal x-rays 04/28/2012, 10/20/2007, 09/30/2007 [facility].  Findings: Approximate 9 mm calculus in the mid to distal right ureter causing moderate to severe right hydroureteronephrosis, right renal edema, and mild right perinephric edema.  Numerous bilateral renal calculi, the largest approximating 8 mm and an upper pole calix of the right kidney and 8 mm in an upper pole calix of the left kidney.  No obstructing left ureteral calculi. Tiny cortical cyst in the anterior mid left kidney; no significant focal parenchymal abnormalities involving either kidney.  Urinary bladder unremarkable.  Approximate 2 cm simple cyst in the medial segment left lobe of liver.  No significant focal hepatic parenchymal abnormalities. Anatomic variant in that the left lobe of the liver extends well across midline to the  left upper quadrant.  Normal-appearing spleen, pancreas, and adrenal glands.  Gallbladder unremarkable by CT.  No biliary ductal dilation.  No visible aorto-iliofemoral atherosclerosis.  Widely patent visceral arteries.  No significant lymphadenopathy.  Stomach normal in appearance.  Oral contrast material within the visualized distal esophagus without evidence of hiatal hernia. Small bowel and colon normal in appearance.  Normal appendix in the right upper and mid pelvis.  No ascites.  Small umbilical hernia containing fat.  Uterus and ovaries unremarkable for age, with numerous small ovarian cysts.  Mild endometrial thickening and/or fluid, likely related to the late proliferative or secretory phase of the menstrual cycle.  Minimal, physiologic free fluid in the cul-de- sac.  Phleboliths low in the right side of the pelvis.  Bone window images unremarkable; bone island noted in the roof of the left acetabulum.  Visualized lung bases clear apart from the expected dependent atelectasis posteriorly in the lower lobes. Heart size normal.  IMPRESSION:  1.  Obstructing approximate 9 mm calculus in the mid to distal right ureter. 2.  Numerous bilateral renal calculi. 3.  No acute abnormalities otherwise.  Specifically, no evidence of acute appendicitis. 4.  Small umbilical hernia containing fat.  Original Report Authenticated By: Arnell Sieving, M.D.    Treatments: Cystoscopy, right retrograde pyelogram, JJ stent.  Discharge Exam: Blood pressure 90/51, pulse 95, temperature 98.6 F (37 C), temperature source Oral, resp. rate 20, height 5\' 2"  (1.575 m), weight 134 lb 14.7 oz (61.2 kg), last menstrual period 04/17/2012, SpO2 97.00%. No abdominal or CVA tenderness  Disposition: 06-Home-Health Care Svc   Medication List  As of 05/01/2012 12:41 PM   STOP taking these medications  Tamsulosin HCl 0.4 MG Caps         TAKE these medications         ciprofloxacin 500 MG/5ML (10%) suspension    Commonly known as: CIPRO   Take by mouth 2 (two) times daily. uti      traMADol 50 MG tablet   Commonly known as: ULTRAM   Take 50 mg by mouth every 6 (six) hours as needed. pain           Follow-up Information    Follow up with Eskridge, Lowella Petties, MD.   Contact information:   [address] [facility] [place] [address] [phone]          Signed: Lindaann Slough 05/01/2012, 12:41 PM

## 2012-05-01 NOTE — Progress Notes (Addendum)
Dr. Brunilda Payor to floor to speak with pt.

## 2012-05-02 LAB — URINE CULTURE
Colony Count: NO GROWTH
Culture  Setup Time: 201305240340
Culture: NO GROWTH

## 2012-05-06 ENCOUNTER — Encounter (HOSPITAL_COMMUNITY): Payer: Self-pay | Admitting: Urology

## 2012-05-08 ENCOUNTER — Other Ambulatory Visit: Payer: Self-pay | Admitting: Urology

## 2012-05-13 ENCOUNTER — Encounter (HOSPITAL_COMMUNITY): Payer: Self-pay | Admitting: Pharmacy Technician

## 2012-05-18 ENCOUNTER — Encounter (HOSPITAL_COMMUNITY): Payer: Self-pay | Admitting: *Deleted

## 2012-05-18 NOTE — Progress Notes (Signed)
Pt reminded no Aspirin products for 3 days prior to ESWL

## 2012-05-20 ENCOUNTER — Encounter (HOSPITAL_COMMUNITY): Payer: Self-pay | Admitting: Pharmacy Technician

## 2012-05-21 ENCOUNTER — Encounter (HOSPITAL_COMMUNITY)
Admission: RE | Disposition: A | Discharge status: Home / Self Care | Payer: Self-pay | Source: Ambulatory Visit | Attending: Urology

## 2012-05-21 ENCOUNTER — Encounter (HOSPITAL_COMMUNITY): Payer: Self-pay | Admitting: *Deleted

## 2012-05-21 ENCOUNTER — Ambulatory Visit (HOSPITAL_COMMUNITY)
Admission: RE | Admit: 2012-05-21 | Discharge: 2012-05-21 | Disposition: A | Discharge status: Home / Self Care | Payer: BC Managed Care – PPO | Source: Ambulatory Visit | Attending: Urology | Admitting: Urology

## 2012-05-21 ENCOUNTER — Ambulatory Visit (HOSPITAL_COMMUNITY): Payer: BC Managed Care – PPO

## 2012-05-21 DIAGNOSIS — N201 Calculus of ureter: Secondary | ICD-10-CM | POA: Insufficient documentation

## 2012-05-21 DIAGNOSIS — N200 Calculus of kidney: Secondary | ICD-10-CM | POA: Insufficient documentation

## 2012-05-21 DIAGNOSIS — Z79899 Other long term (current) drug therapy: Secondary | ICD-10-CM | POA: Insufficient documentation

## 2012-05-21 DIAGNOSIS — N1330 Unspecified hydronephrosis: Secondary | ICD-10-CM | POA: Insufficient documentation

## 2012-05-21 LAB — PREGNANCY, URINE: Preg Test, Ur: NEGATIVE

## 2012-05-21 SURGERY — LITHOTRIPSY, ESWL
Anesthesia: LOCAL | Laterality: Right

## 2012-05-21 MED ORDER — DIPHENHYDRAMINE HCL 25 MG PO CAPS
ORAL_CAPSULE | ORAL | Status: AC
  Administered 2012-05-21: 25 mg via ORAL
  Filled 2012-05-21: qty 1

## 2012-05-21 MED ORDER — HYDROCODONE-ACETAMINOPHEN 5-325 MG PO TABS
1.0000 | ORAL_TABLET | Freq: Once | ORAL | Status: AC
  Administered 2012-05-21: 1 via ORAL

## 2012-05-21 MED ORDER — DIPHENHYDRAMINE HCL 25 MG PO CAPS
25.0000 mg | ORAL_CAPSULE | ORAL | Status: AC
  Administered 2012-05-21: 25 mg via ORAL

## 2012-05-21 MED ORDER — DIAZEPAM 5 MG PO TABS
10.0000 mg | ORAL_TABLET | ORAL | Status: AC
  Administered 2012-05-21: 10 mg via ORAL

## 2012-05-21 MED ORDER — DEXTROSE-NACL 5-0.45 % IV SOLN
INTRAVENOUS | Status: DC
  Administered 2012-05-21: 12:00:00 via INTRAVENOUS

## 2012-05-21 MED ORDER — DIAZEPAM 5 MG PO TABS
ORAL_TABLET | ORAL | Status: AC
  Administered 2012-05-21: 10 mg via ORAL
  Filled 2012-05-21: qty 2

## 2012-05-21 MED ORDER — CIPROFLOXACIN HCL 500 MG PO TABS
500.0000 mg | ORAL_TABLET | ORAL | Status: AC
  Administered 2012-05-21: 500 mg via ORAL

## 2012-05-21 MED ORDER — HYDROCODONE-ACETAMINOPHEN 5-325 MG PO TABS
ORAL_TABLET | ORAL | Status: AC
  Administered 2012-05-21: 1 via ORAL
  Filled 2012-05-21: qty 1

## 2012-05-21 MED ORDER — CIPROFLOXACIN HCL 500 MG PO TABS
ORAL_TABLET | ORAL | Status: AC
  Administered 2012-05-21: 500 mg via ORAL
  Filled 2012-05-21: qty 1

## 2012-05-21 NOTE — Op Note (Signed)
Refer to [facility] Op Note scanned in the chart 

## 2012-05-21 NOTE — Discharge Instructions (Signed)
Lithotripsy Care After Refer to this sheet for the next few weeks. These discharge instructions provide you with general information on caring for yourself after you leave the hospital. Your caregiver may also give you specific instructions. Your treatment has been planned according to the most current medical practices available, but unavoidable complications sometimes occur. If you have any problems or questions after discharge, please call your caregiver. AFTER THE PROCEDURE   The recovery time will vary with the procedure done.   You will be taken to the recovery area. A nurse will watch and check your progress. Once you are awake, stable, and taking fluids well, you will be allowed to go home as long as there are no problems.   Your urine may have a red tinge for a few days after treatment. Blood loss is usually minimal.   You may have soreness in the back or flank area. This usually goes away after a few days. The procedure can cause blotches or bruises on the back where the pressure wave enters the skin. These marks usually cause only minimal discomfort and should disappear in a short time.   Stone fragments should begin to pass within 24 hours of treatment. However, a delayed passage is not unusual.   You may have pain, discomfort, and feel sick to your stomach (nauseous) when the crushed (pulverized) fragments of stone are passed down the tube from the kidney to the bladder. Stone fragments can pass soon after the procedure and may last for up to 4 to 8 weeks.   A small number of patients may have severe pain when stone fragments are not able to pass, which leads to an obstruction.   If your stone is greater than 1 inch/2.5 centimeters in diameter or if you have multiple stones that have a combined diameter greater than 1 inch/2.5 centimeters, you may require more than 1 treatment.   You must have someone drive you home.  HOME CARE INSTRUCTIONS   Rest at home until you feel your  energy improving.   Only take over-the-counter or prescription medicines for pain, discomfort, or fever as directed by your caregiver. Depending on the type of lithotripsy, you may need to take medicines (antibiotics) that kill germs and anti-inflammatory medicines for a few days.   Drink enough water and fluids to keep your urine clear or pale yellow. This helps "flush" your kidneys. It helps pass any remaining pieces of stone and prevents stones from coming back.   Most people can resume daily activities within 1 or 2 days after standard lithotripsy. It can take longer to recover from laser and percutaneous lithotripsy.   If the stones are in your urinary system, you may be asked to strain your urine at home to look for stones. Any stones that are found can be sent to a medical lab for examination.   Visit your caregiver for a follow-up appointment in a few weeks. Your doctor may remove your stent if you have one. Your caregiver will also check to see whether stone particles still remain.  SEEK MEDICAL CARE IF:   You have an oral temperature above 102 F (38.9 C).   Your pain is not relieved by medicine.   You have a lasting nauseous feeling.   You feel there is too much blood in the urine.   You develop persistent problems with frequent and/or painful urination that does not at least partially improve after 2 days following the procedure.   You have a congested   cough.   You feel lightheaded.   You develop a rash or any other signs that might suggest an allergic problem.   You develop any reaction or side effects to your medicine(s).  SEEK IMMEDIATE MEDICAL CARE IF:   You experience severe back and/or flank pain.   You see nothing but blood when you urinate.   You cannot pass any urine at all.   You have an oral temperature above 102 F (38.9 C), not controlled by medicine.   You develop shortness of breath, difficulty breathing, or chest pain.   You develop vomiting  that will not stop after 6 to 8 hours.   You have a fainting episode.  MAKE SURE YOU:   Understand these instructions.   Will watch your condition.   Will get help right away if you are not doing well or get worse.  Document Released: 12/15/2007 Document Revised: 11/14/2011 Document Reviewed: 12/15/2007 [employer] Patient Information 2012 [employer]. 

## 2012-05-21 NOTE — H&P (Signed)
History of Present Illness  Samantha [name] had a right JJ stent inserted on 5/24 for fever, right hydronephrosis secondary to a 9 mm mid ureteral calculus.  She has not had any pain since.  She does not have any fever.  She voids well. Her urine is grossly clear.  The stone was not seen on KUB 2 days before she started having fever and severe right flank pain. Will get KUB to see if the stone is opaque   Surgical History Problems  1. History of  Cystoscopy With Insertion Of Ureteral Stent Right 2. History of  Lithotripsy  Current Meds 1. Ciprofloxacin HCl 500 MG Oral Tablet; Therapy: 20May2013 to 2. Hydrocodone-Acetaminophen 5-325 MG Oral Tablet; TAKE 1 TO 2 TABLETS EVERY 4 TO 6  HOURS AS NEEDED FOR PAIN.  NO MORE THAN 8 TABLETS PER DAY; Therapy: 21May2013 to  (Evaluate:20Jun2013); Last Rx:21May2013 3. Tamsulosin HCl 0.4 MG Oral Capsule; Therapy: 20May2013 to 4. TraMADol HCl 50 MG Oral Tablet; Therapy: 20May2013 to  Allergies Medication  1. No Known Drug Allergies  Family History Problems  1. Family history of  Family Health Status Number Of Children son-12daughter 39mo 2. Family history of  Family Health Status Number Of Children 3 sons 1 daughter  Social History Problems  1. Caffeine Use 1 cup daily 2. Marital History - Currently Married 3. Marital History - Currently Married 4. Never A Smoker 5. Occupation: Archivist  6. History of  Alcohol Use 7. History of  Tobacco Use V15.82  Review of Systems Genitourinary, constitutional, skin, eye, otolaryngeal, hematologic/lymphatic, cardiovascular, pulmonary, endocrine, musculoskeletal, gastrointestinal, neurological and psychiatric system(s) were reviewed and pertinent findings if present are noted.    Vitals Vital Signs [Data Includes: Last 1 Day]  31May2013 10:32AM  Blood Pressure: 118 / 78 Temperature: 98.6 F Heart Rate: 90  Physical Exam Constitutional: Well nourished and well developed . No acute  distress.  ENT:. The ears and nose are normal in appearance.  Neck: The appearance of the neck is normal and no neck mass is present.  Pulmonary: No respiratory distress and normal respiratory rhythm and effort.  Cardiovascular: Heart rate and rhythm are normal . No peripheral edema.  Abdomen: The abdomen is soft and nontender. No masses are palpated. No CVA tenderness. No hernias are palpable. No hepatosplenomegaly noted.  Genitourinary:  Chaperone Present: .  Examination of the external genitalia shows normal female external genitalia and no lesions. The urethra is normal in appearance and not tender. There is no urethral mass. Vaginal exam demonstrates no abnormalities. The cervix is is without abnormalities. The uterus is without abnormalities. The adnexa are palpably normal. The bladder is non tender and not distended. The anus is normal on inspection. The perineum is normal on inspection.  Lymphatics: The femoral and inguinal nodes are not enlarged or tender.  Skin: Normal skin turgor, no visible rash and no visible skin lesions.  Neuro/Psych:. Mood and affect are appropriate.    Results/Data Urine [Data Includes: Last 1 Day]   31May2013  COLOR YELLOW   APPEARANCE CLOUDY   SPECIFIC GRAVITY 1.025   pH 7.0   GLUCOSE NEG mg/dL  BILIRUBIN NEG   KETONE NEG mg/dL  BLOOD LARGE   PROTEIN 100 mg/dL  UROBILINOGEN 0.2 mg/dL  NITRITE NEG   LEUKOCYTE ESTERASE NEG   SQUAMOUS EPITHELIAL/HPF RARE   WBC NONE SEEN WBC/hpf  RBC TNTC RBC/hpf  BACTERIA RARE   CRYSTALS NONE SEEN   CASTS NONE SEEN  KUB INDICATION: Right ureteral calculus KUB shows normal bowel gas pattern.  Psoas shadows are normal. There are bilateral renal calculi. There is a stent in good position in the right renal unit.  There is a 9 mm calcification in the right mid ureter. IMPRESSION: Right ureteral calculus; right JJ stent; bilateral renal calculi.   Assessment Assessed  1. Mid Ureteral Stone On The Right  592.1 2. 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 UA With REFLEX  Done: 31May2013 10:21AM Mid Ureteral Stone On The Right (592.1)  2. KUB  Done: 31May2013 12:00AM   CMET,Uric acid.  I also discuused the treatment options of the right ureteral calculus: ESL versus ureteroscopy with holmium laser and stone manipulation.  The risks, benefits of each option were explained to her.  She chose to have ESL.  The risks of ESL include but are not limited to hemorrhage, infection, ureteral injury, injury to adjacent organs, inability to fragment the stone, steinstrasse.  She understands and wishes to proceed.   Signatures Electronically signed by : Su Grand, M.D.; May 08 2012 11:29AM

## 2012-05-21 NOTE — Progress Notes (Signed)
Patient and husband given instructions for discharge, also given cup and strainer.

## 2012-06-22 ENCOUNTER — Other Ambulatory Visit: Payer: Self-pay | Admitting: Urology

## 2012-06-23 ENCOUNTER — Encounter (HOSPITAL_COMMUNITY): Payer: Self-pay | Admitting: Pharmacy Technician

## 2012-06-29 ENCOUNTER — Encounter (HOSPITAL_COMMUNITY): Payer: Self-pay | Admitting: *Deleted

## 2012-07-02 ENCOUNTER — Ambulatory Visit (HOSPITAL_COMMUNITY)
Admission: RE | Admit: 2012-07-02 | Discharge: 2012-07-02 | Disposition: A | Discharge status: Home / Self Care | Payer: BC Managed Care – PPO | Source: Ambulatory Visit | Attending: Urology | Admitting: Urology

## 2012-07-02 ENCOUNTER — Ambulatory Visit (HOSPITAL_COMMUNITY): Payer: BC Managed Care – PPO

## 2012-07-02 ENCOUNTER — Encounter (HOSPITAL_COMMUNITY)
Admission: RE | Disposition: A | Discharge status: Home / Self Care | Payer: Self-pay | Source: Ambulatory Visit | Attending: Urology

## 2012-07-02 ENCOUNTER — Encounter (HOSPITAL_COMMUNITY): Payer: Self-pay | Admitting: *Deleted

## 2012-07-02 DIAGNOSIS — N200 Calculus of kidney: Secondary | ICD-10-CM | POA: Insufficient documentation

## 2012-07-02 DIAGNOSIS — Z79899 Other long term (current) drug therapy: Secondary | ICD-10-CM | POA: Insufficient documentation

## 2012-07-02 HISTORY — DX: Urinary tract infection, site not specified: N39.0

## 2012-07-02 SURGERY — LITHOTRIPSY, ESWL
Anesthesia: LOCAL | Laterality: Left

## 2012-07-02 MED ORDER — HYDROCODONE-ACETAMINOPHEN 5-325 MG PO TABS
1.0000 | ORAL_TABLET | ORAL | Status: DC | PRN
  Administered 2012-07-02: 1 via ORAL
  Filled 2012-07-02: qty 1

## 2012-07-02 MED ORDER — CIPROFLOXACIN HCL 500 MG PO TABS
500.0000 mg | ORAL_TABLET | ORAL | Status: AC
  Administered 2012-07-02: 500 mg via ORAL
  Filled 2012-07-02: qty 1

## 2012-07-02 MED ORDER — DIPHENHYDRAMINE HCL 25 MG PO CAPS
25.0000 mg | ORAL_CAPSULE | ORAL | Status: AC
  Administered 2012-07-02: 25 mg via ORAL
  Filled 2012-07-02: qty 1

## 2012-07-02 MED ORDER — DIAZEPAM 5 MG PO TABS
10.0000 mg | ORAL_TABLET | ORAL | Status: AC
  Administered 2012-07-02: 10 mg via ORAL
  Filled 2012-07-02: qty 2

## 2012-07-02 MED ORDER — DEXTROSE-NACL 5-0.45 % IV SOLN: INTRAVENOUS | Status: DC

## 2012-07-02 NOTE — Op Note (Signed)
Refer to [facility] Op Note scanned in the chart 

## 2012-07-02 NOTE — H&P (Signed)
History of Present Illness     Samantha [name] had ESL right proximal ureteral calculus on 6/13.  She passed several stone fragments.  She also passed another larger stone about a week ago.  She is known to have bilateral renal calculi.  KUB shows a 7-8 mm left upper pole renal calculus and several small bilateral renal calculi. Serum calcium and uric acid are within normal limits.   Surgical History Problems  1. History of  Cystoscopy With Insertion Of Ureteral Stent Right 2. History of  Lithotripsy 3. History of  Lithotripsy  Current Meds 1. Tamsulosin HCl 0.4 MG Oral Capsule; Therapy: 20May2013 to 2. TraMADol HCl 50 MG Oral Tablet; Therapy: 20May2013 to  Allergies Medication  1. No Known Drug Allergies  Family History Problems  1. Family history of  Family Health Status Number Of Children son-12daughter 29mo 2. Family history of  Family Health Status Number Of Children 3 sons 1 daughter  Social History Problems  1. Caffeine Use 1 cup daily 2. Marital History - Currently Married 3. Marital History - Currently Married 4. Never A Smoker 5. Occupation: Archivist  6. History of  Alcohol Use 7. History of  Tobacco Use V15.82  Review of Systems Genitourinary, constitutional, skin, eye, otolaryngeal, hematologic/lymphatic, cardiovascular, pulmonary, endocrine, musculoskeletal, gastrointestinal, neurological and psychiatric system(s) were reviewed and pertinent findings if present are noted.    Vitals Vital Signs [Data Includes: Last 1 Day]  10Jul2013 09:12AM  Blood Pressure: 119 / 75 Heart Rate: 85 Respiration: 18  Physical Exam Constitutional: Well nourished and well developed . No acute distress.  ENT:. The ears and nose are normal in appearance.  Neck: The appearance of the neck is normal and no neck mass is present.  Pulmonary: No respiratory distress and normal respiratory rhythm and effort.  Cardiovascular: Heart rate and rhythm are normal . No  peripheral edema.  Abdomen: The abdomen is soft and nontender. No masses are palpated. No CVA tenderness. No hernias are palpable. No hepatosplenomegaly noted.  Genitourinary:  Chaperone Present: .  Examination of the external genitalia shows normal female external genitalia and no lesions. The urethra is normal in appearance and not tender. There is no urethral mass. Vaginal exam demonstrates no abnormalities. The adnexa are palpably normal. The bladder is non tender and not distended. The anus is normal on inspection. The perineum is normal on inspection.  Lymphatics: The femoral and inguinal nodes are not enlarged or tender.  Skin: Normal skin turgor, no visible rash and no visible skin lesions.  Neuro/Psych:. Mood and affect are appropriate.    Results/Data Urine [Data Includes: Last 1 Day]   10Jul2013  COLOR YELLOW   APPEARANCE CLEAR   SPECIFIC GRAVITY 1.020   pH 6.0   GLUCOSE NEG mg/dL  BILIRUBIN NEG   KETONE NEG mg/dL  BLOOD MOD   PROTEIN NEG mg/dL  UROBILINOGEN 0.2 mg/dL  NITRITE NEG   LEUKOCYTE ESTERASE TRACE   SQUAMOUS EPITHELIAL/HPF FEW   WBC 0-2 WBC/hpf  RBC 3-6 RBC/hpf  BACTERIA FEW   CRYSTALS NONE SEEN   CASTS NONE SEEN   Other MUCUS     KUB INDICATION: S/P ESL right proximal ureteral calculus. KUB shows normal bowel gas pattern.  Psoas shadows are normal.  There is a 7-8 mm left upper pole renal calculus.  There are several small bilateral renal calculi.  There is no calcification in the course of the ureters.  The treated ureteral calculus is no longer present. IMPRESSION: Bilateral renal calculi.  Ureteral calculus is no longer present.   Assessment Assessed  1. 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 UA With REFLEX  Done: 10Jul2013 09:00AM Mid Ureteral Stone On The Right (592.1)  2. KUB  Done: 10Jul2013 12:00AM 3. Stone Analysis  Done: 10Jul2013 Nephrolithiasis Of Both Kidneys (592.0)  4. Follow-up Schedule Surgery  Office  Follow-up  Done: 10Jul2013   Send stone for composition analysis.  I advised her to have ESL of the larger left renal calculus.  She understands and wishes to proceed.   Signatures Electronically signed by : Su Grand, M.D.; Jun 17 2012 10:21AM

## 2014-08-28 ENCOUNTER — Emergency Department (HOSPITAL_COMMUNITY): Payer: BC Managed Care – PPO

## 2014-08-28 ENCOUNTER — Emergency Department (HOSPITAL_COMMUNITY)
Admission: EM | Admit: 2014-08-28 | Discharge: 2014-08-28 | Disposition: A | Discharge status: Home / Self Care | Payer: BC Managed Care – PPO | Attending: Emergency Medicine | Admitting: Emergency Medicine

## 2014-08-28 ENCOUNTER — Encounter (HOSPITAL_COMMUNITY): Payer: Self-pay | Admitting: Emergency Medicine

## 2014-08-28 DIAGNOSIS — N189 Chronic kidney disease, unspecified: Secondary | ICD-10-CM | POA: Diagnosis not present

## 2014-08-28 DIAGNOSIS — Z3202 Encounter for pregnancy test, result negative: Secondary | ICD-10-CM | POA: Diagnosis not present

## 2014-08-28 DIAGNOSIS — Z87440 Personal history of urinary (tract) infections: Secondary | ICD-10-CM | POA: Diagnosis not present

## 2014-08-28 DIAGNOSIS — N200 Calculus of kidney: Secondary | ICD-10-CM | POA: Diagnosis not present

## 2014-08-28 DIAGNOSIS — R112 Nausea with vomiting, unspecified: Secondary | ICD-10-CM | POA: Diagnosis not present

## 2014-08-28 DIAGNOSIS — Z79899 Other long term (current) drug therapy: Secondary | ICD-10-CM | POA: Diagnosis not present

## 2014-08-28 DIAGNOSIS — R109 Unspecified abdominal pain: Secondary | ICD-10-CM | POA: Insufficient documentation

## 2014-08-28 LAB — CBC
HCT: 38.8 % (ref 36.0–46.0)
HEMOGLOBIN: 13.1 g/dL (ref 12.0–15.0)
MCH: 30 pg (ref 26.0–34.0)
MCHC: 33.8 g/dL (ref 30.0–36.0)
MCV: 89 fL (ref 78.0–100.0)
Platelets: 271 10*3/uL (ref 150–400)
RBC: 4.36 MIL/uL (ref 3.87–5.11)
RDW: 12 % (ref 11.5–15.5)
WBC: 7.5 10*3/uL (ref 4.0–10.5)

## 2014-08-28 LAB — POC URINE PREG, ED: Preg Test, Ur: NEGATIVE

## 2014-08-28 LAB — BASIC METABOLIC PANEL
Anion gap: 12 (ref 5–15)
BUN: 21 mg/dL (ref 6–23)
CALCIUM: 8.7 mg/dL (ref 8.4–10.5)
CO2: 24 mEq/L (ref 19–32)
Chloride: 102 mEq/L (ref 96–112)
Creatinine, Ser: 0.65 mg/dL (ref 0.50–1.10)
GFR calc Af Amer: >90 mL/min (ref >90)
GLUCOSE: 106 mg/dL — AB (ref 70–99)
Potassium: 3.9 mEq/L (ref 3.7–5.3)
SODIUM: 138 meq/L (ref 137–147)

## 2014-08-28 MED ORDER — KETOROLAC TROMETHAMINE 60 MG/2ML IM SOLN
60.0000 mg | Freq: Once | INTRAMUSCULAR | Status: AC
  Administered 2014-08-28: 60 mg via INTRAMUSCULAR
  Filled 2014-08-28: qty 2

## 2014-08-28 MED ORDER — HYDROMORPHONE HCL 1 MG/ML IJ SOLN: 1.0000 mg | Freq: Once | INTRAMUSCULAR | Status: DC

## 2014-08-28 MED ORDER — OXYCODONE-ACETAMINOPHEN 5-325 MG PO TABS
2.0000 | ORAL_TABLET | Freq: Once | ORAL | Status: AC
  Administered 2014-08-28: 2 via ORAL
  Filled 2014-08-28: qty 2

## 2014-08-28 MED ORDER — ONDANSETRON 8 MG PO TBDP
8.0000 mg | ORAL_TABLET | Freq: Once | ORAL | Status: AC
  Administered 2014-08-28: 8 mg via ORAL
  Filled 2014-08-28: qty 1

## 2014-08-28 MED ORDER — OXYCODONE-ACETAMINOPHEN 5-325 MG PO TABS: 1.0000 | ORAL_TABLET | ORAL | Status: DC | PRN

## 2014-08-28 MED ORDER — ONDANSETRON HCL 4 MG/2ML IJ SOLN: 4.0000 mg | Freq: Once | INTRAMUSCULAR | Status: DC

## 2014-08-28 MED ORDER — ONDANSETRON 4 MG PO TBDP: ORAL_TABLET | ORAL | Status: DC

## 2014-08-28 NOTE — ED Notes (Signed)
Pt states the pain in Lt groin x 1week. sts hx of kidney stones in past.   Pt sts doesn't want an IV now.  Will get pain meds.

## 2014-08-28 NOTE — ED Notes (Signed)
Pt refused meds at this time.  States she wants to drive home, and can tolerate the pain.  Nurse told her to let nurse know if she gets to hurting more and needs medications.

## 2014-08-28 NOTE — Discharge Instructions (Signed)
1. Medications: percocet, zofran, usual home medications 2. Treatment: rest, drink plenty of fluids,  3. Follow Up: Please followup with your primary doctor and urology for discussion of your diagnoses and further evaluation after today's visit;   Ureteral Colic (Kidney Stones) Ureteral colic is the result of a condition when kidney stones form inside the kidney. Once kidney stones are formed they may move into the tube that connects the kidney with the bladder (ureter). If this occurs, this condition may cause pain (colic) in the ureter.  CAUSES  Pain is caused by stone movement in the ureter and the obstruction caused by the stone. SYMPTOMS  The pain comes and goes as the ureter contracts around the stone. The pain is usually intense, sharp, and stabbing in character. The location of the pain may move as the stone moves through the ureter. When the stone is near the kidney the pain is usually located in the back and radiates to the belly (abdomen). When the stone is ready to pass into the bladder the pain is often located in the lower abdomen on the side the stone is located. At this location, the symptoms may mimic those of a urinary tract infection with urinary frequency. Once the stone is located here it often passes into the bladder and the pain disappears completely. TREATMENT   Your caregiver will provide you with medicine for pain relief.  You may require specialized follow-up X-rays.  The absence of pain does not always mean that the stone has passed. It may have just stopped moving. If the urine remains completely obstructed, it can cause loss of kidney function or even complete destruction of the involved kidney. It is your responsibility and in your interest that X-rays and follow-ups as suggested by your caregiver are completed. Relief of pain without passage of the stone can be associated with severe damage to the kidney, including loss of kidney function on that side.  If your stone  does not pass on its own, additional measures may be taken by your caregiver to ensure its removal. HOME CARE INSTRUCTIONS   Increase your fluid intake. Water is the preferred fluid since juices containing vitamin C may acidify the urine making it less likely for certain stones (uric acid stones) to pass.  Strain all urine. A strainer will be provided. Keep all particulate matter or stones for your caregiver to inspect.  Take your pain medicine as directed.  Make a follow-up appointment with your caregiver as directed.  Remember that the goal is passage of your stone. The absence of pain does not mean the stone is gone. Follow your caregiver's instructions.  Only take over-the-counter or prescription medicines for pain, discomfort, or fever as directed by your caregiver. SEEK MEDICAL CARE IF:   Pain cannot be controlled with the prescribed medicine.  You have a fever.  Pain continues for longer than your caregiver advises it should.  There is a change in the pain, and you develop chest discomfort or constant abdominal pain.  You feel faint or pass out. MAKE SURE YOU:   Understand these instructions.  Will watch your condition.  Will get help right away if you are not doing well or get worse. Document Released: 09/04/2005 Document Revised: 03/22/2013 Document Reviewed: 05/22/2011 [facility] Patient Information 2015 [place], [place]. This information is not intended to replace advice given to you by your health care provider. Make sure you discuss any questions you have with your health care provider.

## 2014-08-28 NOTE — ED Notes (Signed)
Patient is alert and oriented x3.  She was given DC instructions and follow up visit instructions.  Patient gave verbal understanding. She was DC ambulatory under her own power to home.  V/S stable.  He was not showing any signs of distress on DC 

## 2014-08-28 NOTE — ED Provider Notes (Signed)
CSN: [id number]     Arrival date & time 08/28/14  1814 History   First MD Initiated Contact with Patient 08/28/14 1835     Chief Complaint  Patient presents with  . Flank Pain  . Dysuria     (Consider location/radiation/quality/duration/timing/severity/associated sxs/prior Treatment) Patient is a 38 y.o. female presenting with flank pain and dysuria. The history is provided by the patient and medical records. No language interpreter was used.  Flank Pain Associated symptoms include abdominal pain (L groin), nausea and vomiting. Pertinent negatives include no chest pain, coughing, diaphoresis, fatigue, fever, headaches or rash.  Dysuria Associated symptoms: abdominal pain (L groin), flank pain, nausea and vomiting   Associated symptoms: no fever     Samantha [name] is a 38 y.o. female  with a hx of nephrolithiasis with multiple episodes of lithotripsy and cystoscopy with stenting last in 2013 presents to the Emergency Department complaining of gradual, persistent, progressively worsening left flank pain onset one week ago, acutely worsening last night. Associated symptoms include dysuria, nausea, vomiting.  Patient has been taking Aleve which initially made symptoms better but has stopped working in the last 24 hours. Nothing seems to make it worse. Patient denies fever, chills, headache, neck pain chest pain, shortness of breath, diarrhea, weakness, dizziness, syncope.   Patient does endorse some associated left groin pain consistent with previous episodes of her nephrolithiasis.  Patient follows with [facility] - Dr. Brunilda Payor.   Past Medical History  Diagnosis Date  . Chronic kidney disease     right ureteral calculus  . UTI (urinary tract infection)     history of   Past Surgical History  Procedure Laterality Date  . Cystoscopy w/ retrogrades  04/30/2012    Procedure: CYSTOSCOPY WITH RETROGRADE PYELOGRAM;  Surgeon: Lindaann Slough, MD;  Location: [facility];  Service: Urology;   Laterality: Right;  WITH STENT INSERTION/RIGHT RIGID URETEROSCOPY  . Lithotripsy  2010   No family history on file. History  Substance Use Topics  . Smoking status: Never Smoker   . Smokeless tobacco: Not on file  . Alcohol Use: No   OB History   Grav Para Term Preterm Abortions TAB SAB Ect Mult Living   0 1 0 1 0 0 4     Review of Systems  Constitutional: Negative for fever, diaphoresis, appetite change, fatigue and unexpected weight change.  HENT: Negative for mouth sores.   Eyes: Negative for visual disturbance.  Respiratory: Negative for cough, chest tightness, shortness of breath and wheezing.   Cardiovascular: Negative for chest pain.  Gastrointestinal: Positive for nausea, vomiting and abdominal pain (L groin). Negative for diarrhea and constipation.  Endocrine: Negative for polydipsia, polyphagia and polyuria.  Genitourinary: Positive for dysuria and flank pain. Negative for urgency, frequency and hematuria.  Musculoskeletal: Negative for back pain and neck stiffness.  Skin: Negative for rash.  Allergic/Immunologic: Negative for immunocompromised state.  Neurological: Negative for syncope, light-headedness and headaches.  Hematological: Does not bruise/bleed easily.  Psychiatric/Behavioral: Negative for sleep disturbance. The patient is not nervous/anxious.       Allergies  Review of patient's allergies indicates no known allergies.  Home Medications   Prior to Admission medications   Medication Sig Start Date End Date Taking? Authorizing Provider  BIOTIN PO Take 1 tablet by mouth daily.   Yes Historical Provider, MD  naproxen (NAPROSYN) 500 MG tablet Take 500 mg by mouth 2 (two) times daily as needed (pain.).   Yes Historical Provider, MD  Norethin  Ace-Eth Estrad-FE (MINASTRIN 24 FE) 1-20 MG-MCG(24) CHEW Chew 1 tablet by mouth daily.   Yes Historical Provider, MD  ondansetron (ZOFRAN ODT) 4 MG disintegrating tablet  ODT q4 hours prn nausea/vomit 08/28/14    Minard Millirons, PA-C  oxyCODONE-acetaminophen (PERCOCET/ROXICET) 5-325 MG per tablet Take 1-2 tablets by mouth every 4 (four) hours as needed for moderate pain or severe pain. 08/28/14   Keana Dueitt, PA-C   BP 134/70  Pulse 90  Temp(Src) 98.9 F (37.2 C) (Oral)  Resp 18  SpO2 100%  LMP 08/16/2014 Physical Exam  Nursing note and vitals reviewed. Constitutional: She appears well-developed and well-nourished. No distress.  HENT:  Head: Normocephalic and atraumatic.  Mouth/Throat: Oropharynx is clear and moist. No oropharyngeal exudate.  Cardiovascular: Normal rate, regular rhythm, normal heart sounds and intact distal pulses.   No murmur heard. Pulmonary/Chest: Effort normal and breath sounds normal. No respiratory distress. She has no wheezes.  Abdominal: Soft. Bowel sounds are normal. She exhibits no distension and no mass. There is no tenderness. There is CVA tenderness (Left). There is no rebound and no guarding.  Abdomen soft and nontender Mild left CVA tenderness  Musculoskeletal: Normal range of motion. She exhibits no edema.  Neurological: She is alert.  Skin: Skin is warm and dry. No rash noted. She is not diaphoretic.  Psychiatric: She has a normal mood and affect.    ED Course  Procedures (including critical care time) Labs Review Labs Reviewed  BASIC METABOLIC PANEL - Abnormal; Notable for the following:    Glucose, Bld 106 (*)    All other components within normal limits  CBC  POC URINE PREG, ED    Imaging Review Ct Renal Stone Study  08/28/2014   CLINICAL DATA:  Left flank pain. History of renal colic. Dysuria and hematuria.  EXAM: CT ABDOMEN AND PELVIS WITHOUT CONTRAST  TECHNIQUE: Multidetector CT imaging of the abdomen and pelvis was performed following the standard protocol without IV contrast.  COMPARISON:  Multiple exams, including 07/23/2012 and 04/30/2012  FINDINGS: Lower chest:  Unremarkable  Hepatobiliary: Is stable 2 cm hypodense lesion  favoring cyst in segment 4b of the liver. Contracted gallbladder.  Spleen: Unremarkable  Pancreas: Unremarkable  Stomach/Bowel: Prominent stool throughout the colon favors constipation. Appendix normal.  Adrenals/urinary tract: Numerous bilateral nonobstructive renal calculi are again identified. These measure up to 3-4 millimeters in long axis with about 10 calculi on each side, slightly more on the right. No ureteral calculus or hydronephrosis. No significant periureteral stranding or perirenal stranding. No calculus in the urinary bladder or female urethra.  Vascular/Lymphatic: Unremarkable  Reproductive: Uterine and adnexal contours unremarkable.  Musculoskeletal: Small umbilical hernia contains adipose tissue. Bone island in the posterior superior acetabulum on the left. No lower thoracic or lumbar spine impingement.  IMPRESSION: 1. Numerous bilateral nonobstructive renal calculi. No ureteral calculus, perirenal stranding, periureteral stranding, hydronephrosis, or urinary bladder calculus. 2.  Prominent stool throughout the colon favors constipation. 3. Stable 2 cm cyst in the left hepatic lobe. 4. Small umbilical hernia contains adipose tissue.   Electronically Signed   By: Herbie Baltimore M.D.   On: 08/28/2014 20:04     EKG Interpretation None      MDM   Final diagnoses:  Flank pain  Nephrolithiasis    Yashica Sterbenz presents with history of recurrent nephrolithiasis. History of physical today consistent with renal colic. Patient declined IV at this time. Will give oral antiemetic and pain control. Labs pending.  Review shows that patient's last  CT scan was in 2013.  Will obtain CT scan, labs today.  CT scan with numerous bilateral nonobstructive renal calculi current evidence of ureteral calculus, perirenal stranding or hydronephrosis.  Patient reports that she felt as if the stone passed prior to being taken to CT scan, as she had a particularly painful urination and that her pain  significantly improved.  Patient is resting comfortably and tolerating by mouth at this time. There is no evidence of significant hydronephrosis, serum creatine WNL, vitals sign stable and the pt does not have irratractable vomiting. Pt will be dc home with pain medications & has been advised to follow up with PCP.   I have personally reviewed patient's vitals, nursing note and any pertinent labs or imaging.  I performed an undressed physical exam.    It has been determined that no acute conditions requiring further emergency intervention are present at this time. The patient/guardian have been advised of the diagnosis and plan. I reviewed all labs and imaging including any potential incidental findings. We have discussed signs and symptoms that warrant return to the ED, such as intractable vomiting, high fevers or other concerning symptoms.  Patient/guardian has voiced understanding and agreed to follow-up with the PCP or specialist in 3 days.  Vital signs are stable at discharge.   BP 134/70  Pulse 90  Temp(Src) 98.9 F (37.2 C) (Oral)  Resp 18  SpO2 100%  LMP 08/16/2014         Dierdre Forth, PA-C 08/29/14 [phone]

## 2014-08-28 NOTE — ED Notes (Signed)
Pt c/o possible kidney stone that could be blocking. Pt has left lateral abd/flank pain x week. Pt also has trouble urinating and some blood noted in her urine. Pt states that the symptoms are similar to year ago when she had a kidney stone get lodged and had to have stent placement.

## 2014-08-29 NOTE — ED Provider Notes (Signed)
Medical screening examination/treatment/procedure(s) were performed by non-physician practitioner and as supervising physician I was immediately available for consultation/collaboration.  Megan Docherty, MD 08/29/14 1316 

## 2014-10-10 ENCOUNTER — Encounter (HOSPITAL_COMMUNITY): Payer: Self-pay | Admitting: Emergency Medicine

## 2016-03-27 ENCOUNTER — Other Ambulatory Visit: Payer: Self-pay | Admitting: Orthopedic Surgery

## 2016-03-27 DIAGNOSIS — S6991XS Unspecified injury of right wrist, hand and finger(s), sequela: Secondary | ICD-10-CM

## 2016-05-20 ENCOUNTER — Ambulatory Visit
Admission: RE | Admit: 2016-05-20 | Discharge: 2016-05-20 | Disposition: A | Discharge status: Home / Self Care | Payer: BLUE CROSS/BLUE SHIELD | Source: Ambulatory Visit | Attending: Orthopedic Surgery | Admitting: Orthopedic Surgery

## 2016-05-20 DIAGNOSIS — S6991XS Unspecified injury of right wrist, hand and finger(s), sequela: Secondary | ICD-10-CM

## 2016-05-20 MED ORDER — IOPAMIDOL (ISOVUE-M 200) INJECTION 41%
2.0000 mL | Freq: Once | INTRAMUSCULAR | Status: DC
Start: 2016-05-20 — End: 2016-05-21

## 2018-02-23 ENCOUNTER — Encounter (HOSPITAL_COMMUNITY): Payer: Self-pay | Admitting: *Deleted

## 2018-03-03 ENCOUNTER — Other Ambulatory Visit: Payer: Self-pay | Admitting: Obstetrics and Gynecology

## 2018-03-04 ENCOUNTER — Other Ambulatory Visit: Payer: Self-pay

## 2018-03-04 ENCOUNTER — Encounter (HOSPITAL_COMMUNITY): Payer: Self-pay | Admitting: *Deleted

## 2018-03-12 ENCOUNTER — Encounter (HOSPITAL_COMMUNITY)
Admission: AD | Disposition: A | Discharge status: Home / Self Care | Payer: Self-pay | Source: Ambulatory Visit | Attending: Obstetrics and Gynecology

## 2018-03-12 ENCOUNTER — Other Ambulatory Visit: Payer: Self-pay

## 2018-03-12 ENCOUNTER — Ambulatory Visit (HOSPITAL_COMMUNITY): Payer: BLUE CROSS/BLUE SHIELD | Admitting: Anesthesiology

## 2018-03-12 ENCOUNTER — Ambulatory Visit (HOSPITAL_COMMUNITY)
Admission: AD | Admit: 2018-03-12 | Discharge: 2018-03-12 | Disposition: A | Discharge status: Home / Self Care | Payer: BLUE CROSS/BLUE SHIELD | Source: Ambulatory Visit | Attending: Obstetrics and Gynecology | Admitting: Obstetrics and Gynecology

## 2018-03-12 ENCOUNTER — Encounter (HOSPITAL_COMMUNITY): Payer: Self-pay | Admitting: *Deleted

## 2018-03-12 DIAGNOSIS — N921 Excessive and frequent menstruation with irregular cycle: Secondary | ICD-10-CM | POA: Insufficient documentation

## 2018-03-12 DIAGNOSIS — Z79899 Other long term (current) drug therapy: Secondary | ICD-10-CM | POA: Insufficient documentation

## 2018-03-12 HISTORY — PX: DILITATION & CURRETTAGE/HYSTROSCOPY WITH NOVASURE ABLATION: SHX5568

## 2018-03-12 HISTORY — DX: Other seasonal allergic rhinitis: J30.2

## 2018-03-12 HISTORY — DX: Personal history of urinary calculi: Z87.442

## 2018-03-12 LAB — CBC
HCT: 37.3 % (ref 36.0–46.0)
HEMOGLOBIN: 12.7 g/dL (ref 12.0–15.0)
MCH: 30 pg (ref 26.0–34.0)
MCHC: 34 g/dL (ref 30.0–36.0)
MCV: 88.2 fL (ref 78.0–100.0)
Platelets: 230 10*3/uL (ref 150–400)
RBC: 4.23 MIL/uL (ref 3.87–5.11)
RDW: 12.6 % (ref 11.5–15.5)
WBC: 5.5 10*3/uL (ref 4.0–10.5)

## 2018-03-12 LAB — HCG, SERUM, QUALITATIVE: PREG SERUM: NEGATIVE

## 2018-03-12 SURGERY — DILATATION & CURETTAGE/HYSTEROSCOPY WITH NOVASURE ABLATION
Anesthesia: General

## 2018-03-12 MED ORDER — GLYCOPYRROLATE 0.2 MG/ML IJ SOLN
INTRAMUSCULAR | Status: AC
  Filled 2018-03-12: qty 1

## 2018-03-12 MED ORDER — PROPOFOL 10 MG/ML IV BOLUS
INTRAVENOUS | Status: AC
  Filled 2018-03-12: qty 20

## 2018-03-12 MED ORDER — FENTANYL CITRATE (PF) 100 MCG/2ML IJ SOLN
25.0000 ug | INTRAMUSCULAR | Status: DC | PRN
  Administered 2018-03-12 (×2): 50 ug via INTRAVENOUS

## 2018-03-12 MED ORDER — PROPOFOL 10 MG/ML IV BOLUS
INTRAVENOUS | Status: DC | PRN
  Administered 2018-03-12: 70 mg via INTRAVENOUS
  Administered 2018-03-12: 180 mg via INTRAVENOUS
  Administered 2018-03-12: 20 mg via INTRAVENOUS
  Administered 2018-03-12: 30 mg via INTRAVENOUS

## 2018-03-12 MED ORDER — BUPIVACAINE HCL (PF) 0.25 % IJ SOLN
INTRAMUSCULAR | Status: DC | PRN
  Administered 2018-03-12: 20 mL

## 2018-03-12 MED ORDER — LIDOCAINE HCL (CARDIAC) 20 MG/ML IV SOLN
INTRAVENOUS | Status: AC
  Filled 2018-03-12: qty 5

## 2018-03-12 MED ORDER — DEXAMETHASONE SODIUM PHOSPHATE 10 MG/ML IJ SOLN
INTRAMUSCULAR | Status: AC
  Filled 2018-03-12: qty 1

## 2018-03-12 MED ORDER — MIDAZOLAM HCL 2 MG/2ML IJ SOLN
INTRAMUSCULAR | Status: AC
  Filled 2018-03-12: qty 2

## 2018-03-12 MED ORDER — LACTATED RINGERS IV SOLN
INTRAVENOUS | Status: DC
  Administered 2018-03-12 (×2): via INTRAVENOUS

## 2018-03-12 MED ORDER — SCOPOLAMINE 1 MG/3DAYS TD PT72
MEDICATED_PATCH | TRANSDERMAL | Status: AC
  Filled 2018-03-12: qty 1

## 2018-03-12 MED ORDER — MIDAZOLAM HCL 2 MG/2ML IJ SOLN
INTRAMUSCULAR | Status: DC | PRN
  Administered 2018-03-12: 2 mg via INTRAVENOUS

## 2018-03-12 MED ORDER — FENTANYL CITRATE (PF) 100 MCG/2ML IJ SOLN
INTRAMUSCULAR | Status: AC
  Filled 2018-03-12: qty 4

## 2018-03-12 MED ORDER — FENTANYL CITRATE (PF) 100 MCG/2ML IJ SOLN
INTRAMUSCULAR | Status: AC
  Filled 2018-03-12: qty 2

## 2018-03-12 MED ORDER — CEFAZOLIN SODIUM-DEXTROSE 2-4 GM/100ML-% IV SOLN
2.0000 g | Freq: Once | INTRAVENOUS | Status: AC
  Administered 2018-03-12: 2 g via INTRAVENOUS

## 2018-03-12 MED ORDER — DEXAMETHASONE SODIUM PHOSPHATE 10 MG/ML IJ SOLN
INTRAMUSCULAR | Status: DC | PRN
  Administered 2018-03-12: 10 mg via INTRAVENOUS

## 2018-03-12 MED ORDER — TRAMADOL HCL 50 MG PO TABS: 50.0000 mg | ORAL_TABLET | Freq: Four times a day (QID) | ORAL | 0 refills | Status: DC | PRN

## 2018-03-12 MED ORDER — ONDANSETRON HCL 4 MG/2ML IJ SOLN
INTRAMUSCULAR | Status: AC
  Filled 2018-03-12: qty 2

## 2018-03-12 MED ORDER — PROPOFOL 10 MG/ML IV BOLUS
INTRAVENOUS | Status: AC
  Filled 2018-03-12: qty 40

## 2018-03-12 MED ORDER — FENTANYL CITRATE (PF) 100 MCG/2ML IJ SOLN
INTRAMUSCULAR | Status: DC | PRN
  Administered 2018-03-12: 100 ug via INTRAVENOUS
  Administered 2018-03-12: 50 ug via INTRAVENOUS

## 2018-03-12 MED ORDER — FENTANYL CITRATE (PF) 250 MCG/5ML IJ SOLN
INTRAMUSCULAR | Status: AC
  Filled 2018-03-12: qty 5

## 2018-03-12 MED ORDER — SODIUM CHLORIDE 0.9 % IR SOLN
Status: DC | PRN
  Administered 2018-03-12: 3000 mL

## 2018-03-12 MED ORDER — SCOPOLAMINE 1 MG/3DAYS TD PT72
1.0000 | MEDICATED_PATCH | Freq: Once | TRANSDERMAL | Status: DC
  Administered 2018-03-12: 1.5 mg via TRANSDERMAL

## 2018-03-12 MED ORDER — ONDANSETRON HCL 4 MG/2ML IJ SOLN
INTRAMUSCULAR | Status: DC | PRN
  Administered 2018-03-12: 4 mg via INTRAVENOUS

## 2018-03-12 MED ORDER — LIDOCAINE HCL (CARDIAC) 20 MG/ML IV SOLN
INTRAVENOUS | Status: DC | PRN
  Administered 2018-03-12: 60 mg via INTRAVENOUS

## 2018-03-12 MED ORDER — CEFAZOLIN SODIUM-DEXTROSE 2-4 GM/100ML-% IV SOLN
INTRAVENOUS | Status: AC
  Filled 2018-03-12: qty 100

## 2018-03-12 SURGICAL SUPPLY — 14 items
ABLATOR ENDOMETRIAL BIPOLAR (ABLATOR) ×2 IMPLANT
CANISTER SUCT 3000ML PPV (MISCELLANEOUS) ×2 IMPLANT
CATH ROBINSON RED A/P 16FR (CATHETERS) ×2 IMPLANT
GLOVE BIO SURGEON STRL SZ7.5 (GLOVE) ×2 IMPLANT
GLOVE BIOGEL PI IND STRL 7.0 (GLOVE) ×1 IMPLANT
GLOVE BIOGEL PI INDICATOR 7.0 (GLOVE) ×1
GOWN STRL REUS W/TWL LRG LVL3 (GOWN DISPOSABLE) ×4 IMPLANT
PACK VAGINAL MINOR WOMEN LF (CUSTOM PROCEDURE TRAY) ×2 IMPLANT
PAD OB MATERNITY 4.3X12.25 (PERSONAL CARE ITEMS) ×2 IMPLANT
PAD PREP 24X48 CUFFED NSTRL (MISCELLANEOUS) ×2 IMPLANT
SYR TB 1ML 25GX5/8 (SYRINGE) ×2 IMPLANT
TOWEL OR 17X24 6PK STRL BLUE (TOWEL DISPOSABLE) ×4 IMPLANT
TUBING AQUILEX INFLOW (TUBING) ×2 IMPLANT
TUBING AQUILEX OUTFLOW (TUBING) ×2 IMPLANT

## 2018-03-12 NOTE — Progress Notes (Signed)
Patient ID: Samantha HaJulissa Methvin, female   DOB: 05/07/1976, 42 y.o.   MRN: [id number] Patient seen and examined. Consent witnessed and signed. No changes noted. Update completed.

## 2018-03-12 NOTE — Op Note (Signed)
03/12/2018  11:43 AM  PATIENT:  Samantha [name]  42 y.o. female  PRE-OPERATIVE DIAGNOSIS:  Menorrhagia Thickened endometrium  POST-OPERATIVE DIAGNOSIS:  Menorrhagia Endometrial polyp  PROCEDURE:  Procedure(s): DILATATION & CURETTAGE/ HYSTEROSCOPY WITH NOVASURE ABLATION ENDOMETRIAL POLYPECTOMY  SURGEON:  Surgeon(s): Olivia Mackieaavon, Mylia Pondexter, MD  ASSISTANTS: none   ANESTHESIA:   local and general  ESTIMATED BLOOD LOSS: MINIMAL    DRAINS: none   LOCAL MEDICATIONS USED:  MARCAINE    and Amount: 20 ml  SPECIMEN:  Source of Specimen:  EMC WITH POLYP FRAGMENTS  DISPOSITION OF SPECIMEN:  PATHOLOGY  COUNTS:  YES  DICTATION #: [id number]  PLAN OF CARE: DC HOME  PATIENT DISPOSITION:  PACU - hemodynamically stable.

## 2018-03-12 NOTE — Anesthesia Postprocedure Evaluation (Signed)
Anesthesia Post Note  Patient: Samantha [name]  Procedure(s) Performed: DILATATION & CURETTAGE/HYSTEROSCOPY WITH NOVASURE ABLATION (N/A )     Patient location during evaluation: PACU Anesthesia Type: General Level of consciousness: awake and alert Pain management: pain level controlled Vital Signs Assessment: post-procedure vital signs reviewed and stable Respiratory status: spontaneous breathing, nonlabored ventilation, respiratory function stable and patient connected to nasal cannula oxygen Cardiovascular status: blood pressure returned to baseline and stable Postop Assessment: no apparent nausea or vomiting Anesthetic complications: no    Last Vitals:  Vitals:   03/12/18 1300 03/12/18 1345  BP: (!) 120/94 137/83  Pulse: 68 77  Resp: 15 18  Temp:    SpO2: 99% 99%    Last Pain:  Vitals:   03/12/18 1300  TempSrc:   PainSc: 2    Pain Goal: Patients Stated Pain Goal: 4 (03/12/18 0945)               Cristela BlueJACKSON,Terren Jandreau EDWARD

## 2018-03-12 NOTE — Anesthesia Preprocedure Evaluation (Signed)
Anesthesia Evaluation  Patient identified by MRN, date of birth, ID band Patient awake    Reviewed: Allergy & Precautions, H&P , NPO status , Patient's Chart, lab work & pertinent test results, reviewed documented beta blocker date and time   Airway Mallampati: II  TM Distance: >3 FB Neck ROM: full    Dental no notable dental hx.    Pulmonary neg pulmonary ROS,    Pulmonary exam normal breath sounds clear to auscultation       Cardiovascular Exercise Tolerance: Good negative cardio ROS   Rhythm:regular Rate:Normal     Neuro/Psych negative neurological ROS  negative psych ROS   GI/Hepatic negative GI ROS, Neg liver ROS,   Endo/Other  negative endocrine ROS  Renal/GU negative Renal ROS  negative genitourinary   Musculoskeletal   Abdominal   Peds  Hematology negative hematology ROS (+)   Anesthesia Other Findings   Reproductive/Obstetrics negative OB ROS                             Anesthesia Physical Anesthesia Plan  ASA: II  Anesthesia Plan: General   Post-op Pain Management:    Induction: Intravenous  PONV Risk Score and Plan:   Airway Management Planned: LMA  Additional Equipment:   Intra-op Plan:   Post-operative Plan:   Informed Consent: I have reviewed the patients History and Physical, chart, labs and discussed the procedure including the risks, benefits and alternatives for the proposed anesthesia with the patient or authorized representative who has indicated his/her understanding and acceptance.   Dental Advisory Given  Plan Discussed with: CRNA and Surgeon  Anesthesia Plan Comments: ( )        Anesthesia Quick Evaluation

## 2018-03-12 NOTE — Transfer of Care (Signed)
Immediate Anesthesia Transfer of Care Note  Patient: Samantha [name]  Procedure(s) Performed: DILATATION & CURETTAGE/HYSTEROSCOPY WITH NOVASURE ABLATION (N/A )  Patient Location: PACU  Anesthesia Type:General  Level of Consciousness: awake, alert  and oriented  Airway & Oxygen Therapy: Patient Spontanous Breathing and Patient connected to nasal cannula oxygen  Post-op Assessment: Report given to RN and Post -op Vital signs reviewed and stable  Post vital signs: Reviewed and stable  Last Vitals:  Vitals Value Taken Time  BP 132/90 03/12/2018 12:00 PM  Temp    Pulse 73 03/12/2018 12:02 PM  Resp 14 03/12/2018 12:02 PM  SpO2 99 % 03/12/2018 12:02 PM  Vitals shown include unvalidated device data.  Last Pain:  Vitals:   03/12/18 0945  TempSrc: Oral  PainSc: 0-No pain      Patients Stated Pain Goal: 4 (03/12/18 0945)  Complications: No apparent anesthesia complications

## 2018-03-12 NOTE — Op Note (Signed)
Samantha [name]:  [name], Samantha            ACCOUNT NO.:  [id number]  MEDICAL RECORD NO.:  [id number]  LOCATION:                                 FACILITY:  PHYSICIAN:  Lenoard Adenichard J. Anali Cabanilla, M.D.     DATE OF BIRTH:  DATE OF PROCEDURE: DATE OF DISCHARGE:                              OPERATIVE REPORT   PREOPERATIVE DIAGNOSIS:  Menometrorrhagia.  POSTOPERATIVE DIAGNOSIS:  Menometrorrhagia plus probable endometrial polyp.  PROCEDURE:  Diagnostic hysteroscopy, D and C, endometrial polypectomy, NovaSure endometrial ablation.  SURGEON:  Lenoard Adenichard J. Kelsea Mousel, M.D.  ASSISTANT:  None.  ANESTHESIA:  Local and general.  ESTIMATED BLOOD LOSS:  Less than 50 mL.  FLUID DEFICIT:  25 mL.  COMPLICATIONS:  None.  DRAINS:  None.  COUNTS:  Correct.  DISPOSITION:  The patient to recovery in good condition.  SPECIMEN:  To pathology.  DESCRIPTION OF PROCEDURE:  After being apprised of the risks of anesthesia, infection, bleeding, any surrounding organs, possible need for repair, delayed versus immediate complications to include bowel and bladder injury, possible need for repair, inability to cure all bleeding and/or pelvic pain, the patient was brought to the operating room after consent signed.  She was placed in a dorsal lithotomy position and administered general anesthetic, prepped and draped in usual sterile fashion, catheterized to the bladder was empty.  Exam under anesthesia reveals an anteflexed uterus.  No adnexal masses.  Dilute paracervical block placed, 20 mL Marcaine total.  Cervix easily dilated up to 23 Pratt dilator.  Hysteroscope placed.  Visualization reveals thickened anterior and posterior wall of endometrium, questionable polypoid fragments at left posterior wall.  These were removed without difficulty using polyp forceps.  D and C were then performed using sharp curettage in a 4-quadrant method.  Cavity then appears to be normal, subsequent to removal with normal bilateral tubal  ostia.  No other structural defects noted.  NovaSure device was entered, seated to a length of 6 and a width of 3.5.  After a negative CO2 test, the procedure was initiated for a power of 116 watts for 61 seconds.  At the end of the procedure, the device was removed, inspected and found to be intact.  Revisualization of the endometrial cavity revealed a well-ablated endometrium and no evidence of uterine perforation.  The patient tolerated the procedure well. Deficit and blood loss as noted.  She was transferred to recovery in good condition.     Lenoard Adenichard J. Mel Langan, M.D.     RJT/MEDQ  D:  03/12/2018  T:  03/12/2018  Job:  [id number]

## 2018-03-12 NOTE — Discharge Instructions (Signed)

## 2018-03-12 NOTE — Anesthesia Procedure Notes (Signed)
Procedure Name: LMA Insertion Date/Time: 03/12/2018 11:24 AM Performed by: Algis GreenhouseBurger, Chia Mowers A, CRNA Pre-anesthesia Checklist: Patient being monitored, Patient identified, Emergency Drugs available and Suction available Patient Re-evaluated:Patient Re-evaluated prior to induction Oxygen Delivery Method: Circle system utilized Preoxygenation: Pre-oxygenation with 100% oxygen Induction Type: IV induction and Inhalational induction Ventilation: Mask ventilation without difficulty LMA: LMA inserted LMA Size: 4.0 Number of attempts: 1 Dental Injury: Teeth and Oropharynx as per pre-operative assessment

## 2018-03-12 NOTE — H&P (Signed)
Samantha [name] is an 42 y.o. female. Menorrhagia for definitive surgical therapy. For EAB.  Pertinent Gynecological History: Menses: flow is excessive with use of 2 pads or tampons on heaviest days Bleeding: dysfunctional uterine bleeding Contraception: none DES exposure: denies Blood transfusions: none Sexually transmitted diseases: no past history Previous GYN Procedures: DNC  Last mammogram: normal Date: 2018 Last pap: normal Date: 2019 OB History: G5, P3   Menstrual History: Menarche age: [age] No LMP recorded.    Past Medical History:  Diagnosis Date  . History of kidney stones    right ureteral calculus  . UTI (urinary tract infection)    history of    Past Surgical History:  Procedure Laterality Date  . CYSTOSCOPY W/ RETROGRADES  04/30/2012   Procedure: CYSTOSCOPY WITH RETROGRADE PYELOGRAM;  Surgeon: Lindaann SloughMarc-Henry Nesi, MD;  Location: [facility];  Service: Urology;  Laterality: Right;  WITH STENT INSERTION/RIGHT RIGID URETEROSCOPY  . LITHOTRIPSY  2010    History reviewed. No pertinent family history.  Social History:  reports that she has never smoked. She has never used smokeless tobacco. She reports that she does not drink alcohol or use drugs.  Allergies: No Known Allergies  No medications prior to admission.    Review of Systems  Constitutional: Negative.   All other systems reviewed and are negative.   Height 5\' 1"  (1.549 m), weight 58.5 kg (129 lb). Physical Exam  Nursing note and vitals reviewed. Constitutional: She is oriented to person, place, and time. She appears well-developed and well-nourished.  HENT:  Head: Normocephalic and atraumatic.  Neck: Normal range of motion. Neck supple.  Cardiovascular: Normal rate and regular rhythm.  Respiratory: Effort normal and breath sounds normal.  GI: Soft. Bowel sounds are normal.  Genitourinary: Vagina normal and uterus normal.  Musculoskeletal: Normal range of motion.  Neurological: She is alert and  oriented to person, place, and time. She has normal reflexes.  Skin: Skin is warm and dry.  Psychiatric: She has a normal mood and affect.    No results found for this or any previous visit (from the past 24 hour(s)).  No results found.  Assessment/Plan: Refractory Menorrhagia Diag HS, DNC, Novasure. Consent done. Surgical risks discussed.   Czar Ysaguirre J 03/12/2018, 8:45 AM

## 2018-03-13 ENCOUNTER — Encounter (HOSPITAL_COMMUNITY): Payer: Self-pay | Admitting: Obstetrics and Gynecology

## 2018-04-18 IMAGING — MR MR [PERSON_NAME]*[PERSON_NAME]* W/CM
4 of 7 series · 14 of 40 positions shown · non-contrast
Comparison: Injection images from 05/20/2016

CLINICAL DATA: Index finger ligament injury.

EXAM:
MRI OF THE RIGHT HAND WITH INTRA-ARTICULAR CONTRAST
TECHNIQUE: Multiplanar, multisequence MR imaging was performed following the
administration of intra-articular contrast into the index finger MCP
joint.

[Series 4: T1 fat-sat · sagittal · right · 1.0mm · 0.20mm/px · 3 of 26 slices shown]
[im 1/26]
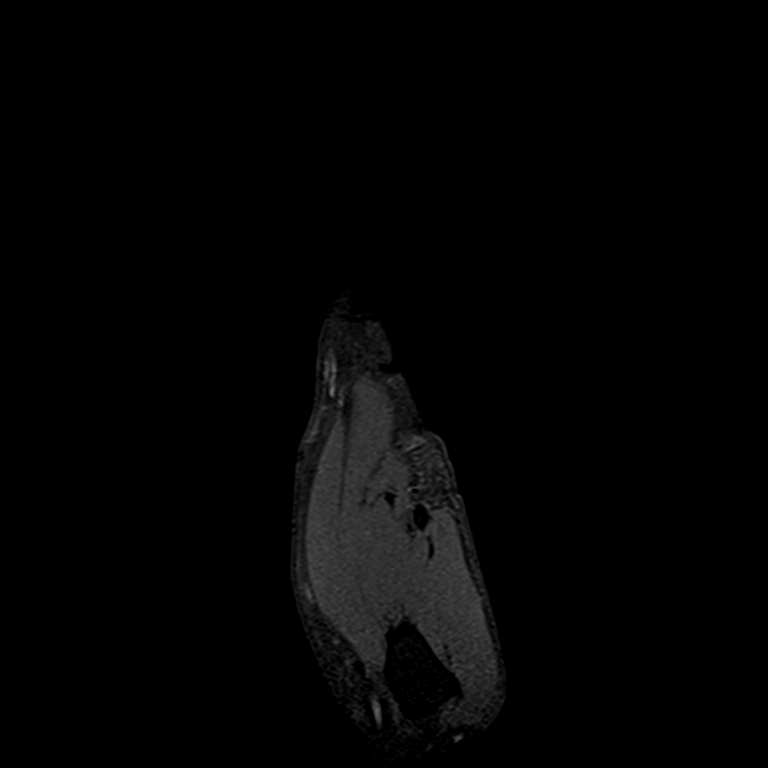
[im 17/26]
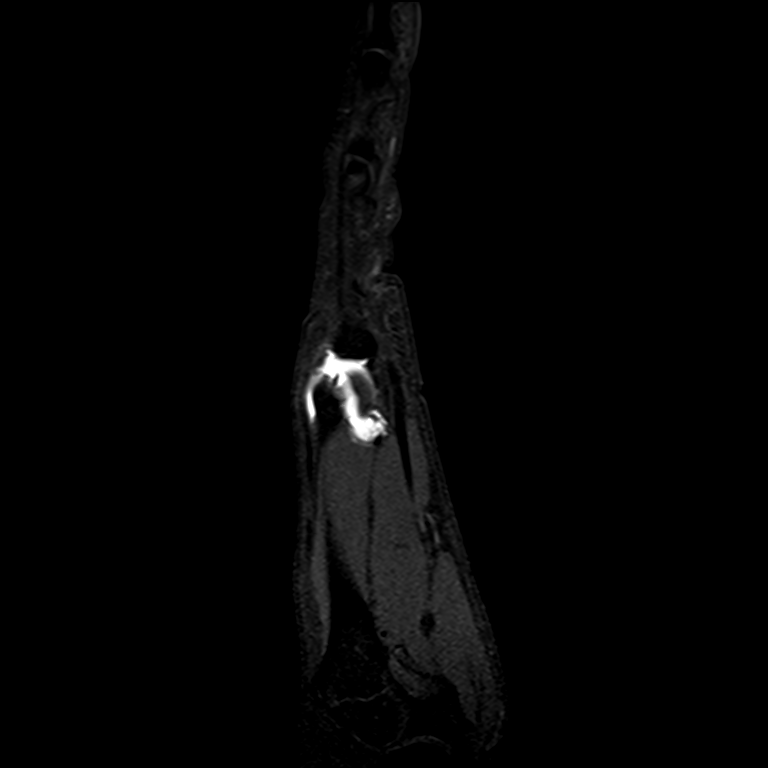
[im 26/26]
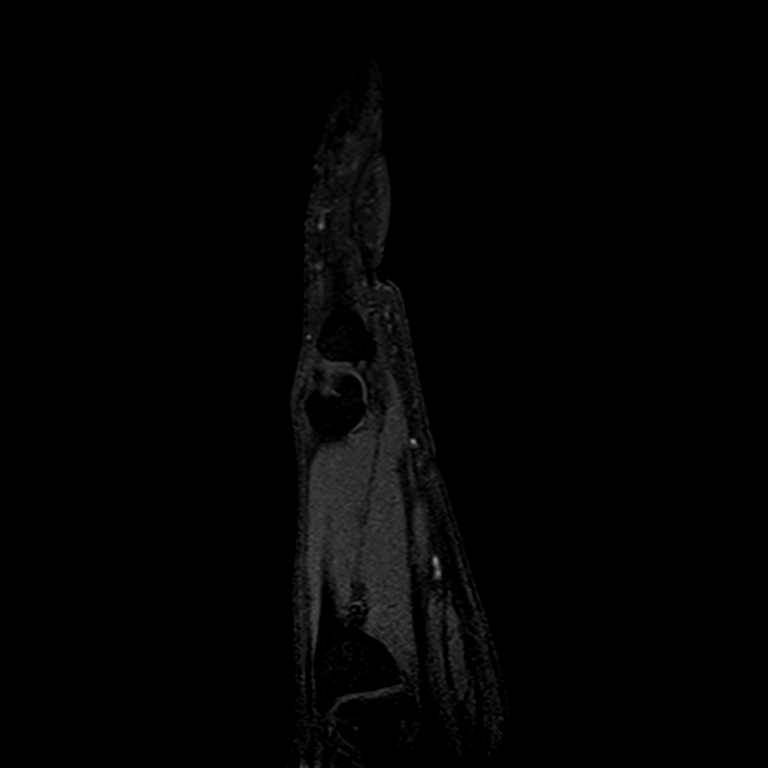

[Series 5: PD fat-sat · sagittal · right · 1.0mm · 0.20mm/px · 4 of 26 slices shown]
[im 1/26]
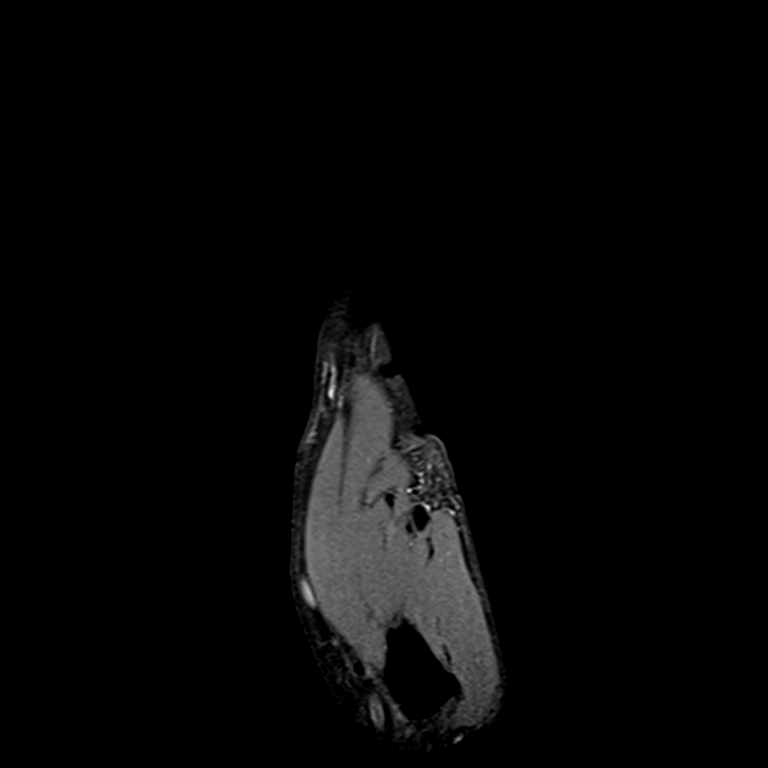
[im 9/26]
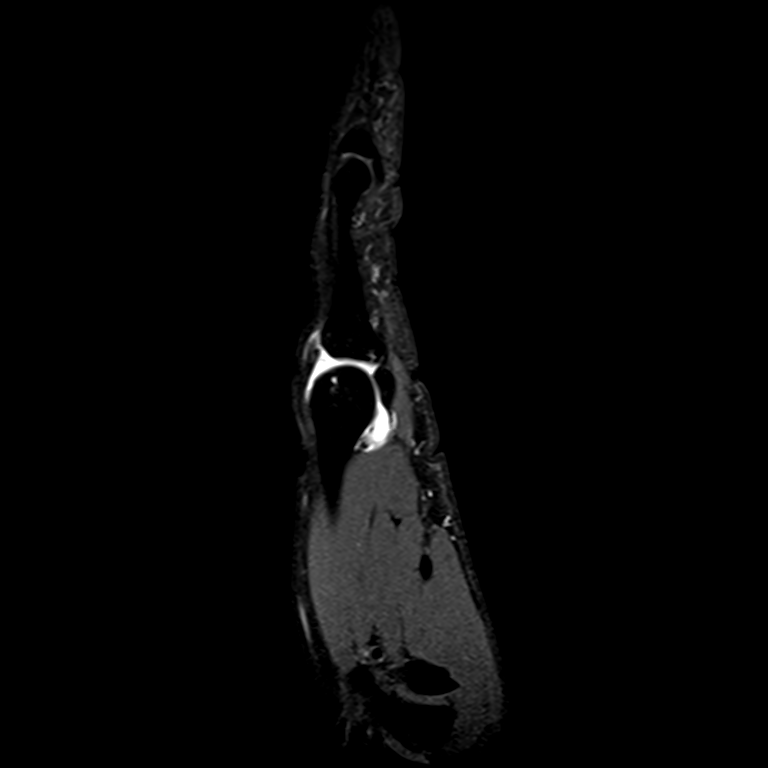
[im 17/26]
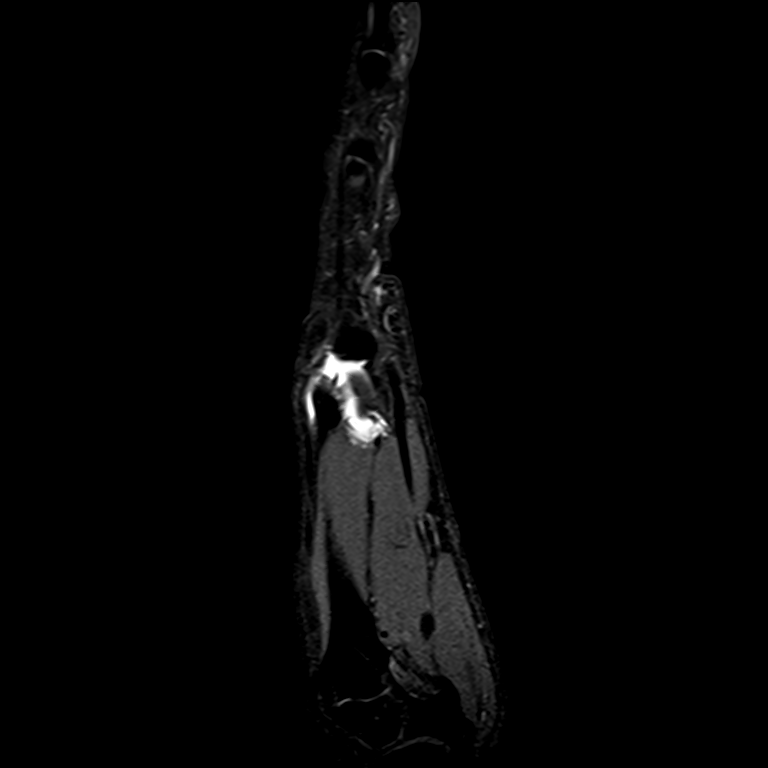
[im 26/26]
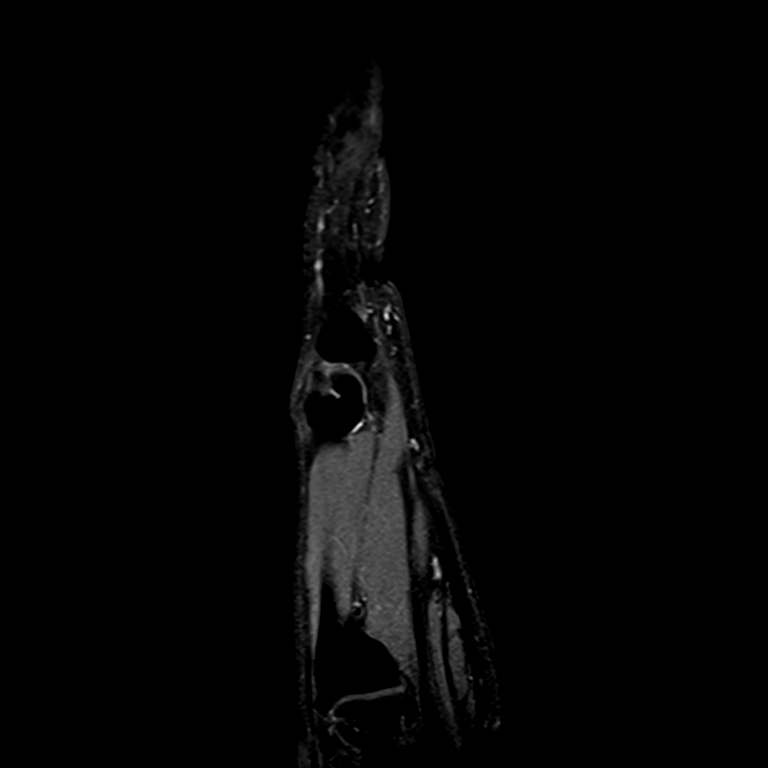

[Series 6: T2 fat-sat · coronal · right · 1.0mm · 0.20mm/px · 4 of 36 slices shown (1 of 2)]
[im 1/36]
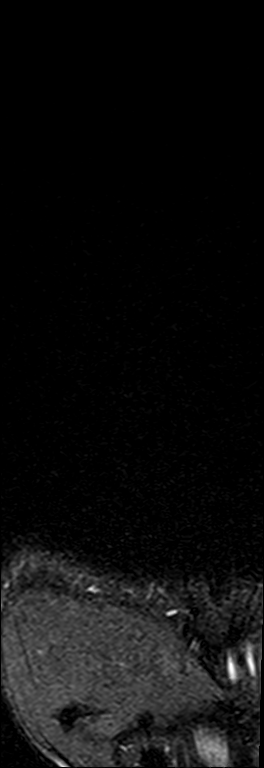
[im 8/36]
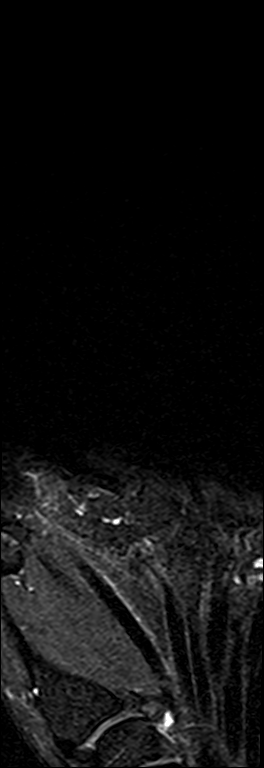
[im 22/36]
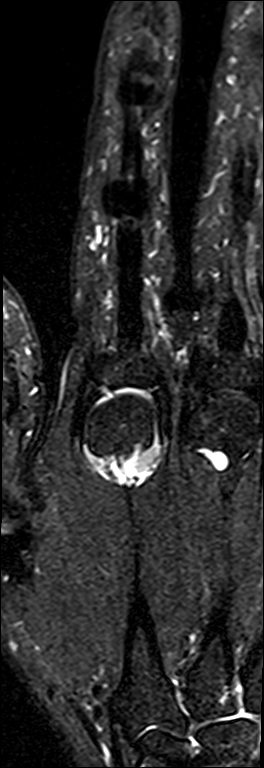
[im 36/36]
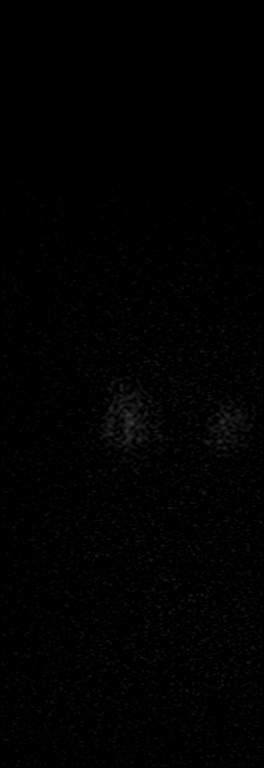

[Series 9: T2 fat-sat · axial · right · 3.0mm · 0.31mm/px · z∈[+10,+77]mm · 3 of 40 slices shown (2 of 2)]
[im 7/40]
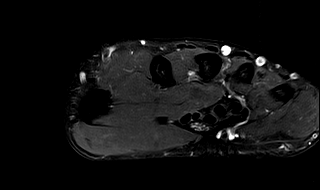
[im 20/40]
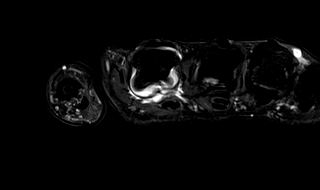
[im 33/40]
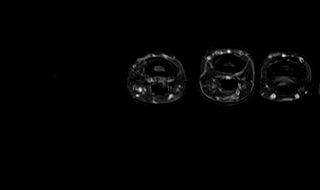

[14 of 40 positions shown; findings below may reference images not displayed]

FINDINGS: There is extravasation of contrast from the proximal radial aspect
of the second MCP joint, more notable distally and dorsally, in the
vicinity of the radial side sagittal band. Assessment is mildly
problematic because this is the vicinity of the injection site using
the 25 gauge needle, the possibility exists that this leak is along
the needle tract.

There is irregularity of the proximal attachment of the volar plate,
which appears disrupted and irregular.

Extensor tendon unremarkable. Flexor tendons within normal limits.
Acute transverse metacarpal ligament intact with a small ossicle
along the radial side.

No other specific abnormality is identified.
IMPRESSION: 1. There are 2 areas of concern along the ligamentous structures of
the second MCP joint. First of all, there is some leak of contrast
through the radial side sagittal band which could represent a small
tear of the sagittal band, although this was the needle puncture
site and accordingly could be related to the needle itself.
Secondly, there is considerable irregularity and laxity at the
proximal attachment of the volar plate, suspicious for tear along
the proximal margin of the volar plate, for example image [DATE].

## 2019-10-15 ENCOUNTER — Other Ambulatory Visit: Payer: Self-pay

## 2019-10-15 DIAGNOSIS — Z20822 Contact with and (suspected) exposure to covid-19: Secondary | ICD-10-CM

## 2019-10-16 LAB — NOVEL CORONAVIRUS, NAA: SARS-CoV-2, NAA: NOT DETECTED

## 2020-09-21 ENCOUNTER — Other Ambulatory Visit: Payer: BC Managed Care – PPO

## 2020-09-21 DIAGNOSIS — Z20822 Contact with and (suspected) exposure to covid-19: Secondary | ICD-10-CM

## 2020-09-22 LAB — SARS-COV-2, NAA 2 DAY TAT

## 2020-09-22 LAB — NOVEL CORONAVIRUS, NAA: SARS-CoV-2, NAA: NOT DETECTED

## 2024-08-11 ENCOUNTER — Ambulatory Visit

## 2024-08-11 VITALS — BP 138/82 | HR 81 | Resp 14 | Ht 61.5 in | Wt 142.0 lb

## 2024-08-11 DIAGNOSIS — M2550 Pain in unspecified joint: Secondary | ICD-10-CM

## 2024-08-11 DIAGNOSIS — M79672 Pain in left foot: Secondary | ICD-10-CM

## 2024-08-11 DIAGNOSIS — M79642 Pain in left hand: Secondary | ICD-10-CM | POA: Diagnosis not present

## 2024-08-11 DIAGNOSIS — M79671 Pain in right foot: Secondary | ICD-10-CM | POA: Diagnosis not present

## 2024-08-11 DIAGNOSIS — M79641 Pain in right hand: Secondary | ICD-10-CM

## 2024-08-11 DIAGNOSIS — R768 Other specified abnormal immunological findings in serum: Secondary | ICD-10-CM | POA: Diagnosis not present
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
## 2024-08-12 LAB — ANA,IFA RA DIAG PNL W/RFLX TIT/PATN
Anti Nuclear Antibody (ANA): NEGATIVE
Cyclic Citrullin Peptide Ab: <16 U
Rheumatoid fact SerPl-aCnc: <10 [IU]/mL (ref <14)

## 2024-08-12 LAB — HEPATIC FUNCTION PANEL
AG Ratio: 1.4 (calc) (ref 1.0–2.5)
ALT: 54 U/L — ABNORMAL HIGH (ref 6–29)
AST: 34 U/L (ref 10–35)
Albumin: 4.6 g/dL (ref 3.6–5.1)
Alkaline phosphatase (APISO): 108 U/L (ref 31–125)
Bilirubin, Direct: 0.3 mg/dL — ABNORMAL HIGH (ref 0.0–0.2)
Globulin: 3.4 g/dL (ref 1.9–3.7)
Indirect Bilirubin: 1.6 mg/dL — ABNORMAL HIGH (ref 0.2–1.2)
Total Bilirubin: 1.9 mg/dL — ABNORMAL HIGH (ref 0.2–1.2)
Total Protein: 8 g/dL (ref 6.1–8.1)

## 2024-08-12 LAB — SJOGREN'S SYNDROME ANTIBODS(SSA + SSB)
SSA (Ro) (ENA) Antibody, IgG: >8 AI — AB
SSB (La) (ENA) Antibody, IgG: <1 AI

## 2024-08-12 LAB — C-REACTIVE PROTEIN: CRP: 3 mg/L (ref <8.0)

## 2024-08-12 LAB — SEDIMENTATION RATE: Sed Rate: 17 mm/h (ref 0–20)

## 2024-08-17 ENCOUNTER — Ambulatory Visit: Payer: Self-pay

## 2024-08-18 NOTE — Progress Notes (Signed)
 Office Visit Note  Patient: Samantha [name]             Date of Birth: Jan 05, 1976           MRN: [id number]             PCP: Samantha Redge SAILOR, FNP Referring: Samantha Redge SAILOR, FNP Visit Date: 08/25/2024 Occupation: Data Unavailable  Subjective:  Results (Discuss lab results. )  History of Present Illness: Samantha [name] is a 48 y.o. female  who is here for a result follow-up. Patient was last seen on 08/11/34 where she had presented for evaluation after being dx with RA by her PCP. She had a hx of positive ANA and RF. At her first appointment, she had evidence of inflammatory arthritis and hx of dry eyes and dry mouth, prompting concern for possible Sjogren's syndrome.   Since her last visit, she continues to experience joint pain and swelling, particularly in her bilateral hands. She admits to improvement throughout the day with use. She admits to AM stiffness lasting ~1 hour. She admits to dry eye and dry mouth, denies any other new symptoms since her prior visit.   Activities of Daily Living:  Patient reports morning stiffness for 1 hour.   Patient Reports nocturnal pain.  Difficulty dressing/grooming: Denies Difficulty climbing stairs: Denies Difficulty getting out of chair: Denies Difficulty using hands for taps, buttons, cutlery, and/or writing: Reports  Review of Systems  Constitutional:  Positive for fatigue.  HENT:  Negative for mouth sores and mouth dryness.   Eyes:  Negative for dryness.  Respiratory:  Negative for shortness of breath.   Cardiovascular:  Negative for chest pain and palpitations.  Gastrointestinal:  Negative for blood in stool, constipation and diarrhea.  Endocrine: Negative for increased urination.  Genitourinary:  Negative for involuntary urination.  Musculoskeletal:  Positive for joint pain, joint pain and morning stiffness. Negative for gait problem, joint swelling, myalgias, muscle weakness, muscle tenderness and myalgias.  Skin:  Negative for  color change, rash, hair loss and sensitivity to sunlight.  Allergic/Immunologic: Negative for susceptible to infections.  Neurological:  Positive for headaches. Negative for dizziness.  Hematological:  Negative for swollen glands.  Psychiatric/Behavioral:  Positive for depressed mood and sleep disturbance. The patient is nervous/anxious.     PMFS History:  Patient Active Problem List   Diagnosis Date Noted   History of gestational diabetes 04/30/2012   Anxiety and depression 04/30/2012   Cystitis 09/21/2007    Past Medical History:  Diagnosis Date   History of kidney stones    right ureteral calculus   Seasonal allergies    SVD (spontaneous vaginal delivery)    x 4   UTI (urinary tract infection)    history of    Family History  Problem Relation Age of Onset   Healthy Mother    Healthy Father    Healthy Sister    Healthy Brother    Healthy Son    Healthy Son    Healthy Son    Healthy Daughter    Past Surgical History:  Procedure Laterality Date   CYSTOSCOPY W/ RETROGRADES  04/30/2012   Procedure: CYSTOSCOPY WITH RETROGRADE PYELOGRAM;  Surgeon: Thomasine Oiler, MD;  Location: [facility];  Service: Urology;  Laterality: Right;  WITH STENT INSERTION/RIGHT RIGID URETEROSCOPY   DILITATION & CURRETTAGE/HYSTROSCOPY WITH NOVASURE ABLATION N/A 03/12/2018   Procedure: DILATATION & CURETTAGE/HYSTEROSCOPY WITH NOVASURE ABLATION;  Surgeon: Gorge Ade, MD;  Location: [facility];  Service: Gynecology;  Laterality: N/A;  LITHOTRIPSY  2010   Social History   Tobacco Use   Smoking status: Never    Passive exposure: Never   Smokeless tobacco: Never  Vaping Use   Vaping status: Never Used  Substance Use Topics   Alcohol use: No   Drug use: No   Social History   Social History Narrative   Not on file     Immunization History  Administered Date(s) Administered   PFIZER(Purple Top)SARS-COV-2 Vaccination 03/03/2020, 03/23/2020     Objective: Vital Signs: BP 131/82 (BP  Location: Left Arm, Patient Position: Sitting, Cuff Size: Small)   Pulse 84   Temp 98.6 F (37 C)   Resp 12   Ht 5' 1.5 (1.562 m)   Wt 146 lb (66.2 kg)   LMP  (LMP Unknown)   BMI 27.14 kg/m    Physical Exam Vitals and nursing note reviewed.  HENT:     Head: Normocephalic and atraumatic.     Nose: Nose normal.  Eyes:     Conjunctiva/sclera: Conjunctivae normal.     Pupils: Pupils are equal, round, and reactive to light.  Cardiovascular:     Rate and Rhythm: Normal rate and regular rhythm.     Heart sounds: Normal heart sounds.  Pulmonary:     Effort: Pulmonary effort is normal.     Breath sounds: Normal breath sounds.  Skin:    General: Skin is warm and dry.  Neurological:     Mental Status: She is alert. Mental status is at baseline.  Psychiatric:        Mood and Affect: Mood normal.        Behavior: Behavior normal.      Musculoskeletal Exam:   CDAI Exam: CDAI Score: -- Patient Global: --; Provider Global: -- Swollen: 6 ; Tender: 6  Joint Exam 08/25/2024      Right  Left  Wrist  Swollen Tender  Swollen Tender  MCP 2  Swollen Tender  Swollen Tender  MCP 3  Swollen Tender  Swollen Tender     Investigation: No additional findings.  Imaging: XR Foot 2 Views Left Result Date: 08/12/2024 No significant joint space narrowing or erosions.  XR Foot 2 Views Right Result Date: 08/12/2024 Right big toe w/ cystic changes. No significant joint space narrowing or erosions.  XR Hand 2 View Right Result Date: 08/12/2024 Mild narrowing of MCPs, moderate narrowing of PIPs, mild narrowing of DIPs; no erosions  XR Hand 2 View Left Result Date: 08/12/2024 Mild carpal crowding, mild narrowing of MCPs, moderate narrowing of PIPs, mild narrowing of DIPs; no erosions   Recent Labs: Lab Results  Component Value Date   WBC 6.7 08/25/2024   HGB 13.4 08/25/2024   PLT 146 08/25/2024   NA 140 08/25/2024   K 4.3 08/25/2024   CL 102 08/25/2024   CO2 27 08/25/2024   GLUCOSE 82  08/25/2024   BUN 12 08/25/2024   CREATININE 0.68 08/25/2024   BILITOT 1.9 (H) 08/25/2024   ALKPHOS 83 04/30/2012   AST 32 08/25/2024   ALT 48 (H) 08/25/2024   PROT 7.6 08/25/2024   PROT 7.9 08/25/2024   ALBUMIN 3.6 04/30/2012   CALCIUM 9.5 08/25/2024   GFRAA >90 08/28/2014    Speciality Comments: No specialty comments available.  Procedures:  No procedures performed Allergies: Patient has no known allergies.   Assessment / Plan:     Visit Diagnoses:  Polyarthritis Sjogren's syndrome with other organ involvement [facility]) Patient with high positive SSA with polyarthritis and  symptoms of dry eyes and dry mouth, consistent with suspected diagnosis of Sjogren's syndrome.   Discussed with patient that Sjogren's syndrome is a chronic autoimmune disease that is characterized by impaired lacrimal and salivary gland function with resultant dryness of the eyes and mouth. Sjogren's syndrome manifestations can vary per individual, and a variety of other disease manifestations affecting multiple organs systems may occur. Discussed with patient the increased risk for development of certain lymphoproliferative disorders and the need for yearly monitoring. Discussed that there is no cure for this disease, but symptoms can be managed with multiple medications.  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 Discussed with patient that hydroxychloroquine  typically is very well tolerated. The most common side effects are nausea and diarrhea, which often improve with time. Less common side effects include rash, hair changes, and muscle weakness. Rarely, hydroxychloroquine  can lead to a pigmented retinopathy, cardiomyopathy, myopathy, arrhythmias due to prolonged QTc. Discussed with patient the need for annual eye exams. Discussed recommendation is for a baseline eye exam within the first year of use, then annually after 5 years of starting HCQ. Patient was given handout on Plaquenil  and  encouraged to ask questions.   Will obtain IFE Interpretation, Kappa/lambda light chains, Serum protein electrophoresis with reflex and C3/C4 today to rule out any lymphoproliferative disease, will plan to repeat q1 year (as well as RF) to monitor for any progression.   Elevated liver enzymes Patient with mildly elevated liver enzymes, unclear etiology at this time. Will obtain Hepatitis B core antibody, IgM, Hepatitis B surface antigen, Hepatitis C antibody, Mitochondrial antibodies, Anti-smooth muscle antibody, IgG   High risk medication use  Starting HCQ for Sjogren's syndrome. Will obtain baseline CBC and CMP today, plan to monitor q6 months   Orders: Orders Placed This Encounter  Procedures   IFE Interpretation   Kappa/lambda light chains   Serum protein electrophoresis with reflex   Hepatitis B core antibody, IgM   Hepatitis B surface antigen   Hepatitis C antibody   Mitochondrial antibodies   Anti-smooth muscle antibody, IgG   CBC with Differential/Platelet   Comprehensive metabolic panel with GFR   Cyclic citrul peptide antibody, IgG   C3 and C4   C3 and C4   Protein electrophoresis, serum   Meds ordered this encounter  Medications   hydroxychloroquine  (PLAQUENIL ) 200 MG tablet    Sig: Take 1 tablet 200 mg BID Monday-Friday    Dispense:  120 tablet    Refill:  2    I personally spent a total of 60 minutes in the care of the patient today including preparing to see the patient, getting/reviewing separately obtained history, performing a medically appropriate exam/evaluation, counseling and educating, placing orders, documenting clinical information in the EHR, independently interpreting results, and communicating results.   Follow-Up Instructions: Return in about 4 months (around 12/25/2024).   Asberry Claw, DO

## 2024-08-25 ENCOUNTER — Ambulatory Visit

## 2024-08-25 VITALS — BP 131/82 | HR 84 | Temp 98.6°F | Resp 12 | Ht 61.5 in | Wt 146.0 lb

## 2024-08-25 DIAGNOSIS — Z79899 Other long term (current) drug therapy: Secondary | ICD-10-CM

## 2024-08-25 DIAGNOSIS — M2550 Pain in unspecified joint: Secondary | ICD-10-CM

## 2024-08-25 DIAGNOSIS — R768 Other specified abnormal immunological findings in serum: Secondary | ICD-10-CM | POA: Diagnosis not present

## 2024-08-25 DIAGNOSIS — M3509 Sicca syndrome with other organ involvement: Secondary | ICD-10-CM | POA: Diagnosis not present

## 2024-08-25 DIAGNOSIS — Z1159 Encounter for screening for other viral diseases: Secondary | ICD-10-CM

## 2024-08-25 DIAGNOSIS — R748 Abnormal levels of other serum enzymes: Secondary | ICD-10-CM

## 2024-08-25 MED ORDER — HYDROXYCHLOROQUINE SULFATE 200 MG PO TABS: ORAL_TABLET | ORAL | 2 refills | Status: DC

## 2024-08-29 ENCOUNTER — Ambulatory Visit: Payer: Self-pay

## 2024-09-01 LAB — CBC WITH DIFFERENTIAL/PLATELET
Absolute Lymphocytes: 2787 {cells}/uL (ref 850–3900)
Absolute Monocytes: 549 {cells}/uL (ref 200–950)
Basophils Absolute: 101 {cells}/uL (ref 0–200)
Basophils Relative: 1.5 %
Eosinophils Absolute: 141 {cells}/uL (ref 15–500)
Eosinophils Relative: 2.1 %
HCT: 40.5 % (ref 35.0–45.0)
Hemoglobin: 13.4 g/dL (ref 11.7–15.5)
MCH: 30 pg (ref 27.0–33.0)
MCHC: 33.1 g/dL (ref 32.0–36.0)
MCV: 90.6 fL (ref 80.0–100.0)
MPV: 12.4 fL (ref 7.5–12.5)
Monocytes Relative: 8.2 %
Neutro Abs: 3122 {cells}/uL (ref 1500–7800)
Neutrophils Relative %: 46.6 %
Platelets: 146 Thousand/uL (ref 140–400)
RBC: 4.47 Million/uL (ref 3.80–5.10)
RDW: 12.4 % (ref 11.0–15.0)
Total Lymphocyte: 41.6 %
WBC: 6.7 Thousand/uL (ref 3.8–10.8)

## 2024-09-01 LAB — COMPREHENSIVE METABOLIC PANEL WITH GFR
AG Ratio: 1.4 (calc) (ref 1.0–2.5)
ALT: 48 U/L — ABNORMAL HIGH (ref 6–29)
AST: 32 U/L (ref 10–35)
Albumin: 4.4 g/dL (ref 3.6–5.1)
Alkaline phosphatase (APISO): 116 U/L (ref 31–125)
BUN: 12 mg/dL (ref 7–25)
CO2: 27 mmol/L (ref 20–32)
Calcium: 9.5 mg/dL (ref 8.6–10.2)
Chloride: 102 mmol/L (ref 98–110)
Creat: 0.68 mg/dL (ref 0.50–0.99)
Globulin: 3.2 g/dL (ref 1.9–3.7)
Glucose, Bld: 82 mg/dL (ref 65–99)
Potassium: 4.3 mmol/L (ref 3.5–5.3)
Sodium: 140 mmol/L (ref 135–146)
Total Bilirubin: 1.9 mg/dL — ABNORMAL HIGH (ref 0.2–1.2)
Total Protein: 7.6 g/dL (ref 6.1–8.1)
eGFR: 107 mL/min/1.73m2 (ref >=60)

## 2024-09-01 LAB — PROTEIN ELECTROPHORESIS, SERUM
Albumin ELP: 4.5 g/dL (ref 3.8–4.8)
Alpha 1: 0.3 g/dL (ref 0.2–0.3)
Alpha 2: 0.6 g/dL (ref 0.5–0.9)
Beta 2: 0.5 g/dL (ref 0.2–0.5)
Beta Globulin: 0.5 g/dL (ref 0.4–0.6)
Gamma Globulin: 1.6 g/dL (ref 0.8–1.7)
Total Protein: 7.9 g/dL (ref 6.1–8.1)

## 2024-09-01 LAB — HEPATITIS C ANTIBODY: Hepatitis C Ab: NONREACTIVE

## 2024-09-01 LAB — HEPATITIS B SURFACE ANTIGEN: Hepatitis B Surface Ag: NONREACTIVE

## 2024-09-01 LAB — KAPPA/LAMBDA LIGHT CHAINS
Kappa free light chain: 23.9 mg/L — ABNORMAL HIGH (ref 3.3–19.4)
Kappa:Lambda Ratio: 0.89 (ref 0.26–1.65)
Lambda Free Lght Chn: 26.9 mg/L — ABNORMAL HIGH (ref 5.7–26.3)

## 2024-09-01 LAB — C3 AND C4
C3 Complement: 142 mg/dL (ref 83–193)
C4 Complement: 16 mg/dL (ref 15–57)

## 2024-09-01 LAB — HEPATITIS B CORE ANTIBODY, IGM: Hep B C IgM: NONREACTIVE

## 2024-09-01 LAB — IFE INTERPRETATION

## 2024-09-01 LAB — CYCLIC CITRUL PEPTIDE ANTIBODY, IGG: Cyclic Citrullin Peptide Ab: <16 U

## 2024-09-01 LAB — ANTI-SMOOTH MUSCLE ANTIBODY, IGG: Actin (Smooth Muscle) Antibody (IGG): <20 U (ref <20)

## 2024-09-01 LAB — MITOCHONDRIAL ANTIBODIES: Mitochondrial M2 Ab, IgG: <=20 U (ref <=20.0)

## 2024-09-08 ENCOUNTER — Ambulatory Visit

## 2024-12-21 NOTE — Progress Notes (Unsigned)
 "   Office Visit Note  Patient: Samantha [name]             Date of Birth: [birth year]           MRN: [id number]             PCP: Mavis Redge SAILOR, FNP Referring: Mavis Redge SAILOR, FNP Visit Date: 12/27/2024 Occupation: Data Unavailable  Subjective:  No chief complaint on file.   History of Present Illness: Samantha [name] is a 49 y.o. female with high positive SSA with polyarthritis and symptoms of dry eyes and dry mouth, consistent with suspected diagnosis of Sjogren's syndrome who presents for a 4 month follow up. Patient was last seen on 08/25/2024 where she was started on  HCQ 400mg  M-F.     Activities of Daily Living:  Patient reports morning stiffness for 0 minutes.   Patient Denies nocturnal pain.  Difficulty dressing/grooming: Denies Difficulty climbing stairs: Denies Difficulty getting out of chair: Denies Difficulty using hands for taps, buttons, cutlery, and/or writing: Reports  Review of Systems  Constitutional:  Positive for fatigue.  HENT:  Positive for mouth dryness. Negative for mouth sores.   Eyes:  Positive for dryness.  Respiratory:  Negative for shortness of breath.   Cardiovascular:  Negative for chest pain and palpitations.  Gastrointestinal:  Positive for constipation. Negative for blood in stool and diarrhea.  Endocrine: Negative for increased urination.  Genitourinary:  Negative for involuntary urination.  Musculoskeletal:  Positive for morning stiffness and muscle tenderness. Negative for joint pain, gait problem, joint pain, joint swelling, myalgias, muscle weakness and myalgias.  Skin:  Negative for color change, rash, hair loss and sensitivity to sunlight.  Allergic/Immunologic: Negative for susceptible to infections.  Neurological:  Positive for dizziness and headaches.  Hematological:  Negative for swollen glands.  Psychiatric/Behavioral:  Positive for depressed mood and sleep disturbance. The patient is nervous/anxious.     PMFS History:   Patient Active Problem List   Diagnosis Date Noted   History of gestational diabetes 04/30/2012   Anxiety and depression 04/30/2012   Cystitis 09/21/2007    Past Medical History:  Diagnosis Date   History of kidney stones    right ureteral calculus   Seasonal allergies    SVD (spontaneous vaginal delivery)    x 4   UTI (urinary tract infection)    history of    Family History  Problem Relation Age of Onset   Healthy Mother    Healthy Father    Healthy Sister    Healthy Brother    Healthy Son    Healthy Son    Healthy Son    Healthy Daughter    Past Surgical History:  Procedure Laterality Date   CYSTOSCOPY W/ RETROGRADES  04/30/2012   Procedure: CYSTOSCOPY WITH RETROGRADE PYELOGRAM;  Surgeon: Thomasine Oiler, MD;  Location: [facility];  Service: Urology;  Laterality: Right;  WITH STENT INSERTION/RIGHT RIGID URETEROSCOPY   DILITATION & CURRETTAGE/HYSTROSCOPY WITH NOVASURE ABLATION N/A 03/12/2018   Procedure: DILATATION & CURETTAGE/HYSTEROSCOPY WITH NOVASURE ABLATION;  Surgeon: Gorge Ade, MD;  Location: [facility];  Service: Gynecology;  Laterality: N/A;   LITHOTRIPSY  2010   Social History[1] Social History   Social History Narrative   Not on file     Immunization History  Administered Date(s) Administered   PFIZER(Purple Top)SARS-COV-2 Vaccination 03/03/2020, 03/23/2020     Objective: Vital Signs: There were no vitals taken for this visit.   Physical Exam   Musculoskeletal Exam: ***  CDAI Exam:  CDAI Score: -- Patient Global: --; Provider Global: -- Swollen: --; Tender: -- Joint Exam 12/27/2024   No joint exam has been documented for this visit   There is currently no information documented on the homunculus. Go to the Rheumatology activity and complete the homunculus joint exam.  Investigation: No additional findings.  Imaging: No results found.  Recent Labs: Lab Results  Component Value Date   WBC 6.7 08/25/2024   HGB 13.4 08/25/2024   PLT 146  08/25/2024   NA 140 08/25/2024   K 4.3 08/25/2024   CL 102 08/25/2024   CO2 27 08/25/2024   GLUCOSE 82 08/25/2024   BUN 12 08/25/2024   CREATININE 0.68 08/25/2024   BILITOT 1.9 (H) 08/25/2024   ALKPHOS 83 04/30/2012   AST 32 08/25/2024   ALT 48 (H) 08/25/2024   PROT 7.6 08/25/2024   PROT 7.9 08/25/2024   ALBUMIN 3.6 04/30/2012   CALCIUM 9.5 08/25/2024   GFRAA >90 08/28/2014    Speciality Comments: No specialty comments available.  Procedures:  No procedures performed Allergies: Patient has no known allergies.   Assessment / Plan:     Visit Diagnoses: No diagnosis found.  Orders: No orders of the defined types were placed in this encounter.  No orders of the defined types were placed in this encounter.   Face-to-face time spent with patient was *** minutes. Greater than 50% of time was spent in counseling and coordination of care.  Follow-Up Instructions: No follow-ups on file.   Alfonso Patterson, LPN  Note - This record has been created using [employer].  Chart creation errors have been sought, but may not always  have been located. Such creation errors do not reflect on  the standard of medical care.     [1]  Social History Tobacco Use   Smoking status: Never    Passive exposure: Never   Smokeless tobacco: Never  Vaping Use   Vaping status: Never Used  Substance Use Topics   Alcohol use: No   Drug use: No   "

## 2024-12-27 ENCOUNTER — Ambulatory Visit

## 2024-12-27 VITALS — BP 124/81 | HR 82 | Temp 98.4°F | Resp 12 | Ht 61.5 in | Wt 147.2 lb

## 2024-12-27 DIAGNOSIS — M2550 Pain in unspecified joint: Secondary | ICD-10-CM

## 2024-12-27 DIAGNOSIS — Z79899 Other long term (current) drug therapy: Secondary | ICD-10-CM

## 2024-12-27 DIAGNOSIS — R748 Abnormal levels of other serum enzymes: Secondary | ICD-10-CM | POA: Diagnosis not present

## 2024-12-27 DIAGNOSIS — M3509 Sicca syndrome with other organ involvement: Secondary | ICD-10-CM | POA: Diagnosis not present

## 2024-12-27 MED ORDER — HYDROXYCHLOROQUINE SULFATE 200 MG PO TABS: ORAL_TABLET | ORAL | 2 refills | Status: AC

## 2025-03-28 ENCOUNTER — Ambulatory Visit
